# Patient Record
Sex: Male | Born: 1995
Health system: Southern US, Community
[De-identification: ages and names within clinical notes are randomized; demographics above are authoritative.]

## PROBLEM LIST (undated history)

## (undated) HISTORY — PX: SKIN SURGERY: SHX2413

## (undated) HISTORY — PX: TONSILLECTOMY AND ADENOIDECTOMY: SHX28

---

## 2019-03-22 ENCOUNTER — Other Ambulatory Visit: Payer: Self-pay

## 2019-03-22 ENCOUNTER — Encounter: Payer: Self-pay | Admitting: Family Medicine

## 2019-03-22 ENCOUNTER — Ambulatory Visit (INDEPENDENT_AMBULATORY_CARE_PROVIDER_SITE_OTHER): Payer: Commercial Managed Care - PPO | Admitting: Family Medicine

## 2019-03-22 ENCOUNTER — Other Ambulatory Visit: Payer: Self-pay | Admitting: Family Medicine

## 2019-03-22 VITALS — BP 126/72 | HR 62 | Temp 98.2°F | Ht 75.0 in | Wt 189.0 lb

## 2019-03-22 DIAGNOSIS — R351 Nocturia: Secondary | ICD-10-CM | POA: Diagnosis not present

## 2019-03-22 DIAGNOSIS — Z0001 Encounter for general adult medical examination with abnormal findings: Secondary | ICD-10-CM | POA: Diagnosis not present

## 2019-03-22 DIAGNOSIS — N50811 Right testicular pain: Secondary | ICD-10-CM | POA: Diagnosis not present

## 2019-03-22 LAB — URINALYSIS, ROUTINE W REFLEX MICROSCOPIC
Bilirubin Urine: NEGATIVE
Hgb urine dipstick: NEGATIVE
Ketones, ur: NEGATIVE
Leukocytes,Ua: NEGATIVE
Nitrite: NEGATIVE
RBC / HPF: NONE SEEN (ref 0–?)
Specific Gravity, Urine: <=1.005 — AB (ref 1.000–1.030)
Total Protein, Urine: NEGATIVE
Urine Glucose: NEGATIVE
Urobilinogen, UA: 0.2 (ref 0.0–1.0)
WBC, UA: NONE SEEN (ref 0–?)
pH: 6 (ref 5.0–8.0)

## 2019-03-22 NOTE — Progress Notes (Signed)
Chief Complaint:  Juan Krueger is a 23 y.o. male who presents today for his annual comprehensive physical exam and to establish care.  Assessment/Plan:  Testicular Pain No abnormalities on exam.  Will check ultrasound.  If continues to have pain, would consider trial of low-dose NSAID versus urology referral.  Discussed reasons return to care.  Nocturia Likely due to polydipsia.  Check UA.  Preventative Healthcare: Flu vaccine declined.   Patient Counseling(The following topics were reviewed and/or handout was given):  -Nutrition: Stressed importance of moderation in sodium/caffeine intake, saturated fat and cholesterol, caloric balance, sufficient intake of fresh fruits, vegetables, and fiber.  -Stressed the importance of regular exercise.   -Substance Abuse: Discussed cessation/primary prevention of tobacco, alcohol, or other drug use; driving or other dangerous activities under the influence; availability of treatment for abuse.   -Injury prevention: Discussed safety belts, safety helmets, smoke detector, smoking near bedding or upholstery.   -Sexuality: Discussed sexually transmitted diseases, partner selection, use of condoms, avoidance of unintended pregnancy and contraceptive alternatives.   -Dental health: Discussed importance of regular tooth brushing, flossing, and dental visits.  -Health maintenance and immunizations reviewed. Please refer to Health maintenance section.  Return to care in 1 year for next preventative visit.     Subjective:  HPI:  He has been having some right testicular pain over the last couple of months. Worsened over the last couple of weeks. No treatments tried. 3/10 at worst.  He had something similar a couple of years ago.  Went to a urologist and told that he had no abnormalities.  Never had an ultrasound.  Not noticed any lumps.  Pain located the bottom part of his testicle.  No burning.  No dysuria.  No difficulty with urination.  He wakes up once  or twice a night to urinate.  Has been trying to cut back on his fluids at night but has not had any significant change in symptoms.  Lifestyle Diet: No specific diets or eating plans.  Exercise: Likes to lift 4-5 times per week.   Depression screen [facility] 2/9 03/22/2019  Decreased Interest 0  Down, Depressed, Hopeless 0  PHQ - 2 Score 0  Altered sleeping 0  Tired, decreased energy 0  Change in appetite 0  Feeling bad or failure about yourself  0  Trouble concentrating 0  Moving slowly or fidgety/restless 0  Suicidal thoughts 0  PHQ-9 Score 0    Health Maintenance Due  Topic Date Due  . HIV Screening  06/02/2010     ROS: Per HPI, otherwise a complete review of systems was negative.   PMH:  The following were reviewed and entered/updated in epic: History reviewed. No pertinent past medical history. There are no active problems to display for this patient.  Past Surgical History:  Procedure Laterality Date  . SKIN SURGERY    . TONSILLECTOMY AND ADENOIDECTOMY      Family History  Problem Relation Age of Onset  . Skin cancer Mother   . Skin cancer Father   . Prostate cancer Paternal Grandmother     Medications- reviewed and updated No current outpatient medications on file.   No current facility-administered medications for this visit.     Allergies-reviewed and updated Not on File  Social History   Socioeconomic History  . Marital status: Single    Spouse name: Not on file  . Number of children: Not on file  . Years of education: Not on file  . Highest education level: Not  on file  Occupational History  . Not on file  Social Needs  . Financial resource strain: Not on file  . Food insecurity    Worry: Not on file    Inability: Not on file  . Transportation needs    Medical: Not on file    Non-medical: Not on file  Tobacco Use  . Smoking status: Never Smoker  . Smokeless tobacco: Never Used  Substance and Sexual Activity  . Alcohol use: Never     Frequency: Never  . Drug use: Never  . Sexual activity: Not on file  Lifestyle  . Physical activity    Days per week: Not on file    Minutes per session: Not on file  . Stress: Not on file  Relationships  . Social Musician on phone: Not on file    Gets together: Not on file    Attends religious service: Not on file    Active member of club or organization: Not on file    Attends meetings of clubs or organizations: Not on file    Relationship status: Not on file  Other Topics Concern  . Not on file  Social History Narrative  . Not on file        Objective:  Physical Exam: BP 126/72   Pulse 62   Temp 98.2 F (36.8 C)   Ht 6\' 3"  (1.905 m)   Wt 189 lb (85.7 kg)   SpO2 99%   BMI 23.62 kg/m   Body mass index is 23.62 kg/m. Wt Readings from Last 3 Encounters:  03/22/19 189 lb (85.7 kg)   Gen: NAD, resting comfortably HEENT: TMs normal bilaterally. OP clear. No thyromegaly noted.  CV: RRR with no murmurs appreciated Pulm: NWOB, CTAB with no crackles, wheezes, or rhonchi GI: Normal bowel sounds present. Soft, Nontender, Nondistended. GU: Normal male genitalia.  Testicles palpated without abnormality. MSK: no edema, cyanosis, or clubbing noted Skin: warm, dry Neuro: CN2-12 grossly intact. Strength 5/5 in upper and lower extremities. Reflexes symmetric and intact bilaterally.  Psych: Normal affect and thought content     Caleb M. 03/24/19, MD 03/22/2019 9:50 AM

## 2019-03-22 NOTE — Patient Instructions (Signed)
It was very nice to see you today!  We will check an ultrasound.  We will check a urine sample.  Administer symptoms worsened over the next 2 weeks.  Come back in 1 year for your next physical, or sooner if needed.  Take care, Dr [name]  Please try these tips to maintain a healthy lifestyle:   Eat at least 3 REAL meals and 1-2 snacks per day.  Aim for no more than 5 hours between eating.  If you eat breakfast, please do so within one hour of getting up.    Obtain twice as many fruits/vegetables as protein or carbohydrate foods for both lunch and dinner. (Half of each meal should be fruits/vegetables, one quarter protein, and one quarter starchy carbs)   Cut down on sweet beverages. This includes juice, soda, and sweet tea.    Exercise at least 150 minutes every week.    Preventive Care 24-24 Years Old, Male Preventive care refers to lifestyle choices and visits with your health care provider that can promote health and wellness. This includes:  A yearly physical exam. This is also called an annual well check.  Regular dental and eye exams.  Immunizations.  Screening for certain conditions.  Healthy lifestyle choices, such as eating a healthy diet, getting regular exercise, not using drugs or products that contain nicotine and tobacco, and limiting alcohol use. What can I expect for my preventive care visit? Physical exam Your health care provider will check:  Height and weight. These may be used to calculate body mass index (BMI), which is a measurement that tells if you are at a healthy weight.  Heart rate and blood pressure.  Your skin for abnormal spots. Counseling Your health care provider may ask you questions about:  Alcohol, tobacco, and drug use.  Emotional well-being.  Home and relationship well-being.  Sexual activity.  Eating habits.  Work and work Statistician. What immunizations do I need?  Influenza (flu) vaccine  This is recommended every  year. Tetanus, diphtheria, and pertussis (Tdap) vaccine  You may need a Td booster every 10 years. Varicella (chickenpox) vaccine  You may need this vaccine if you have not already been vaccinated. Human papillomavirus (HPV) vaccine  If recommended by your health care provider, you may need three doses over 6 months. Measles, mumps, and rubella (MMR) vaccine  You may need at least one dose of MMR. You may also need a second dose. Meningococcal conjugate (MenACWY) vaccine  One dose is recommended if you are 24-24 years old and a Market researcher living in a residence hall, or if you have one of several medical conditions. You may also need additional booster doses. Pneumococcal conjugate (PCV13) vaccine  You may need this if you have certain conditions and were not previously vaccinated. Pneumococcal polysaccharide (PPSV23) vaccine  You may need one or two doses if you smoke cigarettes or if you have certain conditions. Hepatitis A vaccine  You may need this if you have certain conditions or if you travel or work in places where you may be exposed to hepatitis A. Hepatitis B vaccine  You may need this if you have certain conditions or if you travel or work in places where you may be exposed to hepatitis B. Haemophilus influenzae type b (Hib) vaccine  You may need this if you have certain risk factors. You may receive vaccines as individual doses or as more than one vaccine together in one shot (combination vaccines). Talk with your health care provider  about the risks and benefits of combination vaccines. What tests do I need? Blood tests  Lipid and cholesterol levels. These may be checked every 5 years starting at age 24.  Hepatitis C test.  Hepatitis B test. Screening   Diabetes screening. This is done by checking your blood sugar (glucose) after you have not eaten for a while (fasting).  Sexually transmitted disease (STD) testing. Talk with your health care  provider about your test results, treatment options, and if necessary, the need for more tests. Follow these instructions at home: Eating and drinking   Eat a diet that includes fresh fruits and vegetables, whole grains, lean protein, and low-fat dairy products.  Take vitamin and mineral supplements as recommended by your health care provider.  Do not drink alcohol if your health care provider tells you not to drink.  If you drink alcohol: ? Limit how much you have to 0-2 drinks a day. ? Be aware of how much alcohol is in your drink. In the [place] one drink equals one 12 oz bottle of beer (355 mL), one 5 oz glass of wine (148 mL), or one 1 oz glass of hard liquor (44 mL). Lifestyle  Take daily care of your teeth and gums.  Stay active. Exercise for at least 30 minutes on 5 or more days each week.  Do not use any products that contain nicotine or tobacco, such as cigarettes, e-cigarettes, and chewing tobacco. If you need help quitting, ask your health care provider.  If you are sexually active, practice safe sex. Use a condom or other form of protection to prevent STIs (sexually transmitted infections). What's next?  Go to your health care provider once a year for a well check visit.  Ask your health care provider how often you should have your eyes and teeth checked.  Stay up to date on all vaccines. This information is not intended to replace advice given to you by your health care provider. Make sure you discuss any questions you have with your health care provider. Document Released: 08/12/2001 Document Revised: 06/10/2018 Document Reviewed: 06/10/2018 Elsevier Patient Education  2020 Else[employer]  

## 2019-03-22 NOTE — Progress Notes (Signed)
Please inform patient of the following:  Urine is very dilute, but no other abnormalities. His urination issues are probably due to drinking too much fluid - do not need to do any further testing at this time.  Juan Krueger. [name], MD 03/22/2019 4:30 PM

## 2019-03-24 ENCOUNTER — Encounter: Payer: Self-pay | Admitting: Family Medicine

## 2019-03-31 ENCOUNTER — Other Ambulatory Visit: Payer: Self-pay

## 2019-03-31 DIAGNOSIS — Z0001 Encounter for general adult medical examination with abnormal findings: Secondary | ICD-10-CM

## 2019-04-08 ENCOUNTER — Other Ambulatory Visit: Payer: Self-pay

## 2019-04-08 ENCOUNTER — Other Ambulatory Visit (INDEPENDENT_AMBULATORY_CARE_PROVIDER_SITE_OTHER): Payer: Commercial Managed Care - PPO

## 2019-04-08 DIAGNOSIS — Z0001 Encounter for general adult medical examination with abnormal findings: Secondary | ICD-10-CM

## 2019-04-08 LAB — LIPID PANEL
Cholesterol: 152 mg/dL (ref 0–200)
HDL: 43.1 mg/dL (ref >39.00)
LDL Cholesterol: 94 mg/dL (ref 0–99)
NonHDL: 108.97
Total CHOL/HDL Ratio: 4
Triglycerides: 74 mg/dL (ref 0.0–149.0)
VLDL: 14.8 mg/dL (ref 0.0–40.0)

## 2019-04-08 NOTE — Progress Notes (Signed)
Please inform patient of the following:  Cholesterol levels are NORMAL - ok to fill out form for work.  Juan Krueger. [name], MD 04/08/2019 4:15 PM

## 2019-04-11 ENCOUNTER — Ambulatory Visit
Admission: RE | Admit: 2019-04-11 | Discharge: 2019-04-11 | Disposition: A | Discharge status: Home / Self Care | Payer: Commercial Managed Care - PPO | Source: Ambulatory Visit | Attending: Family Medicine | Admitting: Family Medicine

## 2019-04-11 DIAGNOSIS — Z0001 Encounter for general adult medical examination with abnormal findings: Secondary | ICD-10-CM

## 2019-04-11 DIAGNOSIS — N50811 Right testicular pain: Secondary | ICD-10-CM

## 2019-04-12 NOTE — Progress Notes (Signed)
Please inform patient of the following:  Patient has a small varicocele. This is a small dilation of blood vessels near his testicle. This is benign and should lead to any major issues, but this could be what is causing his pain. Recommend urology referral if he is still having significant pain.  Juan Krueger. [name], MD 04/12/2019 8:00 AM

## 2020-05-11 ENCOUNTER — Ambulatory Visit (INDEPENDENT_AMBULATORY_CARE_PROVIDER_SITE_OTHER): Payer: BC Managed Care – PPO | Admitting: Family Medicine

## 2020-05-11 ENCOUNTER — Encounter: Payer: Self-pay | Admitting: Family Medicine

## 2020-05-11 ENCOUNTER — Other Ambulatory Visit: Payer: Self-pay

## 2020-05-11 VITALS — BP 104/60 | HR 64 | Temp 98.1°F | Ht 75.0 in | Wt 200.8 lb

## 2020-05-11 DIAGNOSIS — Z0001 Encounter for general adult medical examination with abnormal findings: Secondary | ICD-10-CM | POA: Diagnosis not present

## 2020-05-11 DIAGNOSIS — I861 Scrotal varices: Secondary | ICD-10-CM | POA: Insufficient documentation

## 2020-05-11 DIAGNOSIS — G8929 Other chronic pain: Secondary | ICD-10-CM | POA: Diagnosis not present

## 2020-05-11 DIAGNOSIS — M25512 Pain in left shoulder: Secondary | ICD-10-CM | POA: Diagnosis not present

## 2020-05-11 NOTE — Progress Notes (Signed)
Chief Complaint:  Juan Krueger is a 24 y.o. male who presents today for his annual comprehensive physical exam.    Assessment/Plan:  New/Acute Problems: Shoulder pain Given mechanical symptoms concerning for possible subluxation or even potential labral tear though pain is not currently significant.  Will place referral to sports medicine for further evaluation/management.  Chronic Problems Addressed Today: Varicocele No red flags.  Symptoms are overall stable.  Occasionally has pain.  Discussed referral to urology however he deferred for the time being.Discussed reasons to return to care.    Preventative Healthcare: UTD.   Patient Counseling(The following topics were reviewed and/or handout was given):  -Nutrition: Stressed importance of moderation in sodium/caffeine intake, saturated fat and cholesterol, caloric balance, sufficient intake of fresh fruits, vegetables, and fiber.  -Stressed the importance of regular exercise.   -Substance Abuse: Discussed cessation/primary prevention of tobacco, alcohol, or other drug use; driving or other dangerous activities under the influence; availability of treatment for abuse.   -Injury prevention: Discussed safety belts, safety helmets, smoke detector, smoking near bedding or upholstery.   -Sexuality: Discussed sexually transmitted diseases, partner selection, use of condoms, avoidance of unintended pregnancy and contraceptive alternatives.   -Dental health: Discussed importance of regular tooth brushing, flossing, and dental visits.  -Health maintenance and immunizations reviewed. Please refer to Health maintenance section.  Return to care in 1 year for next preventative visit.     Subjective:  HPI:  Is been having issues with left shoulder popping for the last couple of years.  No pain at rest.  Has pain with certain motions such as rolling his shoulder from front to back.  Has a history of playing lacrosse and football but does not  remember any specific injuries.  No specific treatments tried.  Symptoms are overall stable not been this way for the past year or more.  Lifestyle Diet: None speicifc.  Exercise: Working on [employer] a few times per week.   Depression screen PHQ 2/9 05/11/2020  Decreased Interest 0  Down, Depressed, Hopeless 0  PHQ - 2 Score 0  Altered sleeping -  Tired, decreased energy -  Change in appetite -  Feeling bad or failure about yourself  -  Trouble concentrating -  Moving slowly or fidgety/restless -  Suicidal thoughts -  PHQ-9 Score -    Health Maintenance Due  Topic Date Due  . Hepatitis C Screening  Never done  . COVID-19 Vaccine (1) Never done  . HIV Screening  Never done  . TETANUS/TDAP  Never done     ROS: Per HPI, otherwise a complete review of systems was negative.   PMH:  The following were reviewed and entered/updated in epic: History reviewed. No pertinent past medical history. Patient Active Problem List   Diagnosis Date Noted  . Varicocele 05/11/2020   Past Surgical History:  Procedure Laterality Date  . SKIN SURGERY    . TONSILLECTOMY AND ADENOIDECTOMY     Family History  Problem Relation Age of Onset  . Skin cancer Mother   . Skin cancer Father   . Prostate cancer Paternal Grandmother    Medications- reviewed and updated No current outpatient medications on file.   No current facility-administered medications for this visit.    Allergies-reviewed and updated No Known Allergies  Social History   Socioeconomic History  . Marital status: Single    Spouse name: Not on file  . Number of children: Not on file  . Years of education: Not on file  .  Highest education level: Not on file  Occupational History  . Not on file  Tobacco Use  . Smoking status: Never Smoker  . Smokeless tobacco: Never Used  Vaping Use  . Vaping Use: Never used  Substance and Sexual Activity  . Alcohol use: Never  . Drug use: Never  . Sexual activity: Not on file    Other Topics Concern  . Not on file  Social History Narrative  . Not on file   Social Determinants of Health   Financial Resource Strain:   . Difficulty of Paying Living Expenses: Not on file  Food Insecurity:   . Worried About Programme researcher, broadcasting/film/video in the Last Year: Not on file  . Ran Out of Food in the Last Year: Not on file  Transportation Needs:   . Lack of Transportation (Medical): Not on file  . Lack of Transportation (Non-Medical): Not on file  Physical Activity:   . Days of Exercise per Week: Not on file  . Minutes of Exercise per Session: Not on file  Stress:   . Feeling of Stress : Not on file  Social Connections:   . Frequency of Communication with Friends and Family: Not on file  . Frequency of Social Gatherings with Friends and Family: Not on file  . Attends Religious Services: Not on file  . Active Member of Clubs or Organizations: Not on file  . Attends Banker Meetings: Not on file  . Marital Status: Not on file        Objective:  Physical Exam: BP 104/60   Pulse 64   Temp 98.1 F (36.7 C) (Temporal)   Ht 6\' 3"  (1.905 m)   Wt 200 lb 12.8 oz (91.1 kg)   SpO2 99%   BMI 25.10 kg/m   Body mass index is 25.1 kg/m. Wt Readings from Last 3 Encounters:  05/11/20 200 lb 12.8 oz (91.1 kg)  03/22/19 189 lb (85.7 kg)   Gen: NAD, resting comfortably HEENT: TMs normal bilaterally. OP clear. No thyromegaly noted.  CV: RRR with no murmurs appreciated Pulm: NWOB, CTAB with no crackles, wheezes, or rhonchi GI: Normal bowel sounds present. Soft, Nontender, Nondistended. MSK: no edema, cyanosis, or clubbing noted.  Left shoulder without obvious deformities.  Popping sensation palpated with passive rolling of shoulder.  Supraspinatus testing intact.  Normal internal and external rotation.  Neurovascular intact distally. Skin: warm, dry Neuro: CN2-12 grossly intact. Strength 5/5 in upper and lower extremities. Reflexes symmetric and intact bilaterally.   Psych: Normal affect and thought content     Juan Krueger M. 03/24/19, MD 05/11/2020 3:37 PM

## 2020-05-11 NOTE — Patient Instructions (Signed)
It was very nice to see you today!  We will place a referral for you to see a sports medicine doctor for your shoulder.  Keep up the good work  I will see you back in a year.  Please come back to see me sooner if needed.  Take care, Dr [name]  Please try these tips to maintain a healthy lifestyle:   Eat at least 3 REAL meals and 1-2 snacks per day.  Aim for no more than 5 hours between eating.  If you eat breakfast, please do so within one hour of getting up.    Each meal should contain half fruits/vegetables, one quarter protein, and one quarter carbs (no bigger than a computer mouse)   Cut down on sweet beverages. This includes juice, soda, and sweet tea.     Drink at least 1 glass of water with each meal and aim for at least 8 glasses per day   Exercise at least 150 minutes every week.    Preventive Care 5-49 Years Old, Male Preventive care refers to lifestyle choices and visits with your health care provider that can promote health and wellness. This includes:  A yearly physical exam. This is also called an annual well check.  Regular dental and eye exams.  Immunizations.  Screening for certain conditions.  Healthy lifestyle choices, such as eating a healthy diet, getting regular exercise, not using drugs or products that contain nicotine and tobacco, and limiting alcohol use. What can I expect for my preventive care visit? Physical exam Your health care provider will check:  Height and weight. These may be used to calculate body mass index (BMI), which is a measurement that tells if you are at a healthy weight.  Heart rate and blood pressure.  Your skin for abnormal spots. Counseling Your health care provider may ask you questions about:  Alcohol, tobacco, and drug use.  Emotional well-being.  Home and relationship well-being.  Sexual activity.  Eating habits.  Work and work Statistician. What immunizations do I need?  Influenza (flu)  vaccine  This is recommended every year. Tetanus, diphtheria, and pertussis (Tdap) vaccine  You may need a Td booster every 10 years. Varicella (chickenpox) vaccine  You may need this vaccine if you have not already been vaccinated. Human papillomavirus (HPV) vaccine  If recommended by your health care provider, you may need three doses over 6 months. Measles, mumps, and rubella (MMR) vaccine  You may need at least one dose of MMR. You may also need a second dose. Meningococcal conjugate (MenACWY) vaccine  One dose is recommended if you are 51-91 years old and a Market researcher living in a residence hall, or if you have one of several medical conditions. You may also need additional booster doses. Pneumococcal conjugate (PCV13) vaccine  You may need this if you have certain conditions and were not previously vaccinated. Pneumococcal polysaccharide (PPSV23) vaccine  You may need one or two doses if you smoke cigarettes or if you have certain conditions. Hepatitis A vaccine  You may need this if you have certain conditions or if you travel or work in places where you may be exposed to hepatitis A. Hepatitis B vaccine  You may need this if you have certain conditions or if you travel or work in places where you may be exposed to hepatitis B. Haemophilus influenzae type b (Hib) vaccine  You may need this if you have certain risk factors. You may receive vaccines as individual doses or  as more than one vaccine together in one shot (combination vaccines). Talk with your health care provider about the risks and benefits of combination vaccines. What tests do I need? Blood tests  Lipid and cholesterol levels. These may be checked every 5 years starting at age 10.  Hepatitis C test.  Hepatitis B test. Screening   Diabetes screening. This is done by checking your blood sugar (glucose) after you have not eaten for a while (fasting).  Sexually transmitted disease (STD)  testing. Talk with your health care provider about your test results, treatment options, and if necessary, the need for more tests. Follow these instructions at home: Eating and drinking   Eat a diet that includes fresh fruits and vegetables, whole grains, lean protein, and low-fat dairy products.  Take vitamin and mineral supplements as recommended by your health care provider.  Do not drink alcohol if your health care provider tells you not to drink.  If you drink alcohol: ? Limit how much you have to 0-2 drinks a day. ? Be aware of how much alcohol is in your drink. In the [place] one drink equals one 12 oz bottle of beer (355 mL), one 5 oz glass of wine (148 mL), or one 1 oz glass of hard liquor (44 mL). Lifestyle  Take daily care of your teeth and gums.  Stay active. Exercise for at least 30 minutes on 5 or more days each week.  Do not use any products that contain nicotine or tobacco, such as cigarettes, e-cigarettes, and chewing tobacco. If you need help quitting, ask your health care provider.  If you are sexually active, practice safe sex. Use a condom or other form of protection to prevent STIs (sexually transmitted infections). What's next?  Go to your health care provider once a year for a well check visit.  Ask your health care provider how often you should have your eyes and teeth checked.  Stay up to date on all vaccines. This information is not intended to replace advice given to you by your health care provider. Make sure you discuss any questions you have with your health care provider. Document Revised: 06/10/2018 Document Reviewed: 06/10/2018 Elsevier Patient Education  2020 [employer].

## 2020-05-11 NOTE — Assessment & Plan Note (Signed)
No red flags.  Symptoms are overall stable.  Occasionally has pain.  Discussed referral to urology however he deferred for the time being.Discussed reasons to return to care.

## 2020-05-22 ENCOUNTER — Ambulatory Visit: Payer: BC Managed Care – PPO | Admitting: Family Medicine

## 2020-05-22 NOTE — Progress Notes (Signed)
I, Philbert Riser, LAT, ATC acting as a scribe for Clementeen Graham, MD.  Subjective:    I'm seeing this patient as a consultation for Dr. Jacquiline Doe. Note will be routed back to referring provider/PCP.  CC: Left shoulder pain  HPI: Pt is a 24yo male c/o pn in his L shoulder. Pt locates pn on the anterior aspect of L shoulder. Pt reports pn has been bothering him for a couple years. No known MOI. Pt notes shoulder feels weaker, pn when lifting weights (overhead press). Popping sounds noted over posterior region. Pt also reports it hurts to sleep on L side.  Rx tried: none  Past medical history, Surgical history, Family history, Social history, Allergies, and medications have been entered into the medical record, reviewed.   Review of Systems: No new headache, visual changes, nausea, vomiting, diarrhea, constipation, dizziness, abdominal pain, skin rash, fevers, chills, night sweats, weight loss, swollen lymph nodes, body aches, joint swelling, muscle aches, chest pain, shortness of breath, mood changes, visual or auditory hallucinations.   Objective:    Vitals:   05/23/20 1606  BP: 120/72  Pulse: (!) 56  SpO2: 100%   General: Well Developed, well nourished, and in no acute distress.  Neuro/Psych: Alert and oriented x3, extra-ocular muscles intact, able to move all 4 extremities, sensation grossly intact. Skin: Warm and dry, no rashes noted.  Respiratory: Not using accessory muscles, speaking in full sentences, trachea midline.  Cardiovascular: Pulses palpable, no extremity edema. Abdomen: Does not appear distended. MSK: Left shoulder normal-appearing Nontender. Normal motion with crepitation. Intact strength abduction external/internal rotation. Mildly positive Hawkins and Neer's test.  Negative Yergason's and speeds test. Mildly positive crossarm compression test. Negative apprehension and relocation test. Pulses cap refill and sensation are intact distally.  Lab and Radiology  Results  X-ray images left shoulder obtained today personally and independently interpreted No fractures or significant malalignment.  No significant DJD. Await formal radiology review  Diagnostic Limited MSK Ultrasound of: Left shoulder Biceps tendon intact normal-appearing Subscapularis tendon intact normal Supraspinatus tendon is intact. Moderate thickness subacromial bursa present. Supraspinatus tendon intact normal-appearing AC joint normal-appearing Impression: Subacromial bursitis   Impression and Recommendations:    Assessment and Plan: 24 y.o. male with left shoulder pain chronic ongoing for years worsening slightly recently.  Dominant finding today is subacromial bursitis on ultrasound.  The main issue is subacromial impingement.  Discussed options with patient.  Plan for trial of physical therapy.  Recheck back in 6 to 8 weeks.  If not better would consider either injection or possibly MRI arthrogram..  PDMP not reviewed this encounter. Orders Placed This Encounter  Procedures  . DG Shoulder Left    Standing Status:   Future    Number of Occurrences:   1    Standing Expiration Date:   05/23/2021    Order Specific Question:   Reason for Exam (SYMPTOM  OR DIAGNOSIS REQUIRED)    Answer:   chronic shoulder pain    Order Specific Question:   Preferred imaging location?    Answer:   [facility]  . [place] LIMITED JOINT SPACE STRUCTURES UP LEFT(NO LINKED CHARGES)    Standing Status:   Future    Number of Occurrences:   1    Standing Expiration Date:   11/20/2020    Order Specific Question:   Reason for Exam (SYMPTOM  OR DIAGNOSIS REQUIRED)    Answer:   chronic left shoulder pain    Order Specific Question:  Preferred imaging location?    Answer:   [facility]  . Ambulatory referral to Physical Therapy    Referral Priority:   Routine    Referral Type:   Physical Medicine    Referral Reason:   Specialty Services Required    Requested  Specialty:   Physical Therapy   No orders of the defined types were placed in this encounter.   Discussed warning signs or symptoms. Please see discharge instructions. Patient expresses understanding.   The above documentation has been reviewed and is accurate and complete Clementeen Graham, M.D.

## 2020-05-23 ENCOUNTER — Encounter: Payer: Self-pay | Admitting: Family Medicine

## 2020-05-23 ENCOUNTER — Ambulatory Visit (INDEPENDENT_AMBULATORY_CARE_PROVIDER_SITE_OTHER): Payer: BC Managed Care – PPO | Admitting: Family Medicine

## 2020-05-23 ENCOUNTER — Other Ambulatory Visit: Payer: Self-pay

## 2020-05-23 ENCOUNTER — Ambulatory Visit: Payer: Self-pay

## 2020-05-23 ENCOUNTER — Ambulatory Visit (INDEPENDENT_AMBULATORY_CARE_PROVIDER_SITE_OTHER): Payer: BC Managed Care – PPO

## 2020-05-23 VITALS — BP 120/72 | HR 56 | Ht 75.0 in | Wt 200.4 lb

## 2020-05-23 DIAGNOSIS — G8929 Other chronic pain: Secondary | ICD-10-CM | POA: Diagnosis not present

## 2020-05-23 DIAGNOSIS — M25512 Pain in left shoulder: Secondary | ICD-10-CM | POA: Diagnosis not present

## 2020-05-23 NOTE — Patient Instructions (Signed)
Thank you for coming in today.  Please get an Xray today before you leave  I've referred you to Physical Therapy.  Let us know if you don't hear from them in one week.  Please use voltaren gel up to 4x daily for pain as needed.   Recheck back with me in 6-8 weeks especially if not better.  If all better no need to follow up.      Shoulder Impingement Syndrome Rehab Ask your health care provider which exercises are safe for you. Do exercises exactly as told by your health care provider and adjust them as directed. It is normal to feel mild stretching, pulling, tightness, or discomfort as you do these exercises. Stop right away if you feel sudden pain or your pain gets worse. Do not begin these exercises until told by your health care provider. Stretching and range-of-motion exercise This exercise warms up your muscles and joints and improves the movement and flexibility of your shoulder. This exercise also helps to relieve pain and stiffness. Passive horizontal adduction In passive adduction, you use your other hand to move the injured arm toward your body. The injured arm does not move on its own. In this movement, your arm is moved across your body in the horizontal plane (horizontal adduction). 1. Sit or stand and pull your left / right elbow across your chest, toward your other shoulder. Stop when you feel a gentle stretch in the back of your shoulder and upper arm. ? Keep your arm at shoulder height. ? Keep your arm as close to your body as you comfortably can. 2. Hold for __________ seconds. 3. Slowly return to the starting position. Repeat __________ times. Complete this exercise __________ times a day. Strengthening exercises These exercises build strength and endurance in your shoulder. Endurance is the ability to use your muscles for a long time, even after they get tired. External rotation, isometric This is an exercise in which you press the back of your wrist against a door  frame without moving your shoulder joint (isometric). 1. Stand or sit in a doorway, facing the door frame. 2. Bend your left / right elbow and place the back of your wrist against the door frame. Only the back of your wrist should be touching the frame. Keep your upper arm at your side. 3. Gently press your wrist against the door frame, as if you are trying to push your arm away from your abdomen (external rotation). Press as hard as you are able without pain. ? Avoid shrugging your shoulder while you press your wrist against the door frame. Keep your shoulder blade tucked down toward the middle of your back. 4. Hold for __________ seconds. 5. Slowly release the tension, and relax your muscles completely before you repeat the exercise. Repeat __________ times. Complete this exercise __________ times a day. Internal rotation, isometric This is an exercise in which you press your palm against a door frame without moving your shoulder joint (isometric). 1. Stand or sit in a doorway, facing the door frame. 2. Bend your left / right elbow and place the palm of your hand against the door frame. Only your palm should be touching the frame. Keep your upper arm at your side. 3. Gently press your hand against the door frame, as if you are trying to push your arm toward your abdomen (internal rotation). Press as hard as you are able without pain. ? Avoid shrugging your shoulder while you press your hand against the door frame.  Keep your shoulder blade tucked down toward the middle of your back. 4. Hold for __________ seconds. 5. Slowly release the tension, and relax your muscles completely before you repeat the exercise. Repeat __________ times. Complete this exercise __________ times a day. Scapular protraction, supine  1. Lie on your back on a firm surface (supine position). Hold a __________ weight in your left / right hand. 2. Raise your left / right arm straight into the air so your hand is directly  above your shoulder joint. 3. Push the weight into the air so your shoulder (scapula) lifts off the surface that you are lying on. The scapula will push up or forward (protraction). Do not move your head, neck, or back. 4. Hold for __________ seconds. 5. Slowly return to the starting position. Let your muscles relax completely before you repeat this exercise. Repeat __________ times. Complete this exercise __________ times a day. Scapular retraction  1. Sit in a stable chair without armrests, or stand up. 2. Secure an exercise band to a stable object in front of you so the band is at shoulder height. 3. Hold one end of the exercise band in each hand. Your palms should face down. 4. Squeeze your shoulder blades together (retraction) and move your elbows slightly behind you. Do not shrug your shoulders upward while you do this. 5. Hold for __________ seconds. 6. Slowly return to the starting position. Repeat __________ times. Complete this exercise __________ times a day. Shoulder extension  1. Sit in a stable chair without armrests, or stand up. 2. Secure an exercise band to a stable object in front of you so the band is above shoulder height. 3. Hold one end of the exercise band in each hand. 4. Straighten your elbows and lift your hands up to shoulder height. 5. Squeeze your shoulder blades together and pull your hands down to the sides of your thighs (extension). Stop when your hands are straight down by your sides. Do not let your hands go behind your body. 6. Hold for __________ seconds. 7. Slowly return to the starting position. Repeat __________ times. Complete this exercise __________ times a day. This information is not intended to replace advice given to you by your health care provider. Make sure you discuss any questions you have with your health care provider. Document Revised: 10/08/2018 Document Reviewed: 07/12/2018 Elsevier Patient Education  2020 [employer].

## 2020-05-28 NOTE — Progress Notes (Signed)
X-ray left shoulder looks normal to radiology

## 2020-06-02 IMAGING — US US SCROTUM W/ DOPPLER COMPLETE
1 series · 14 of 25 positions shown · non-contrast
Comparison: None.

CLINICAL DATA: Right scrotal pain

EXAM:
SCROTAL ULTRASOUND
DOPPLER ULTRASOUND OF THE TESTICLES
TECHNIQUE: Complete ultrasound examination of the testicles, epididymis, and
other scrotal structures was performed. Color and spectral Doppler
ultrasound were also utilized to evaluate blood flow to the
testicles.

[Series 1: us scrotum w/ doppler complete · 0.06mm/px · 14 of 50 slices shown]
[im 1/50]
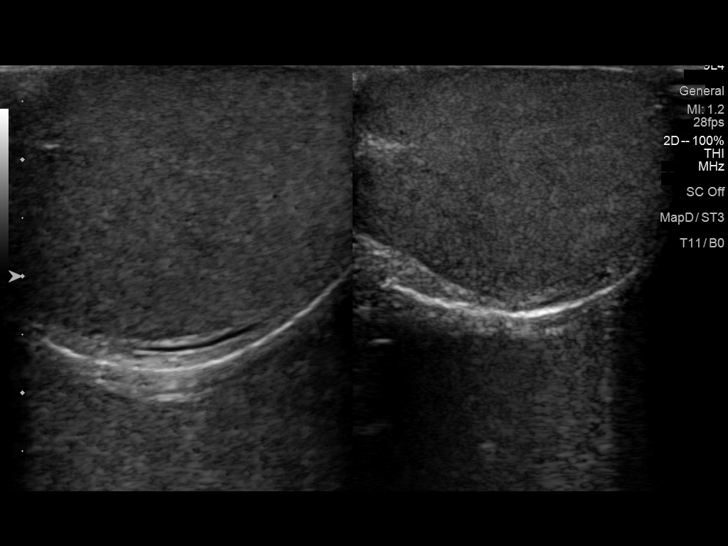
[im 5/50]
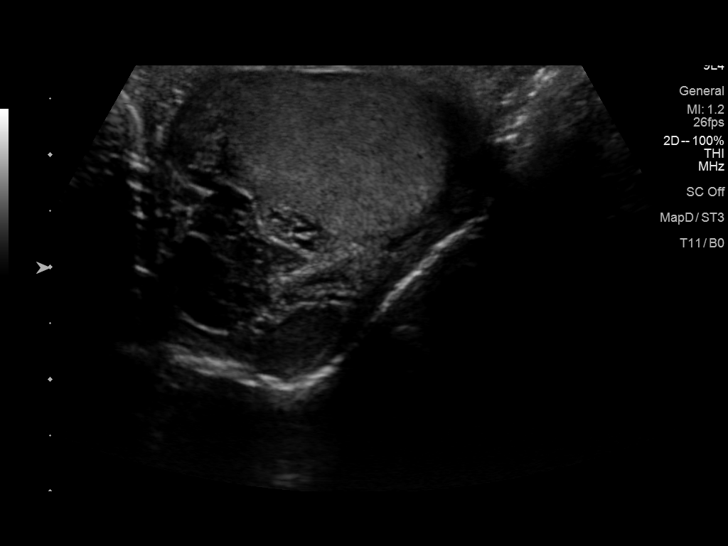
[im 9/50]
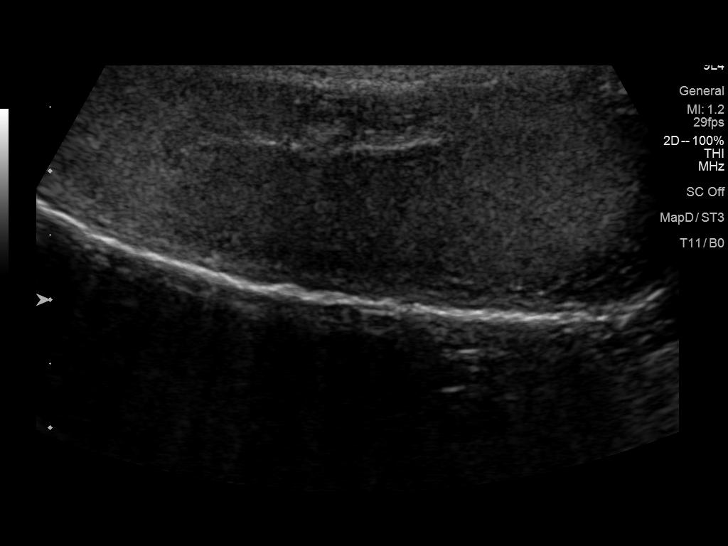
[im 13/50]
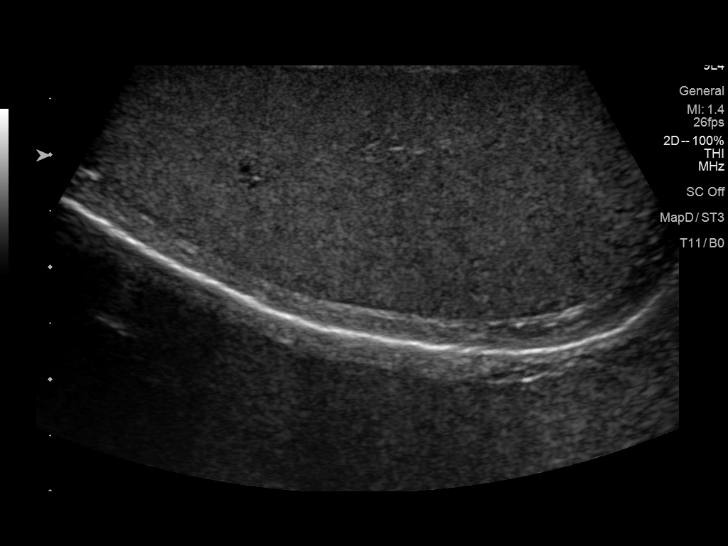
[im 17/50]
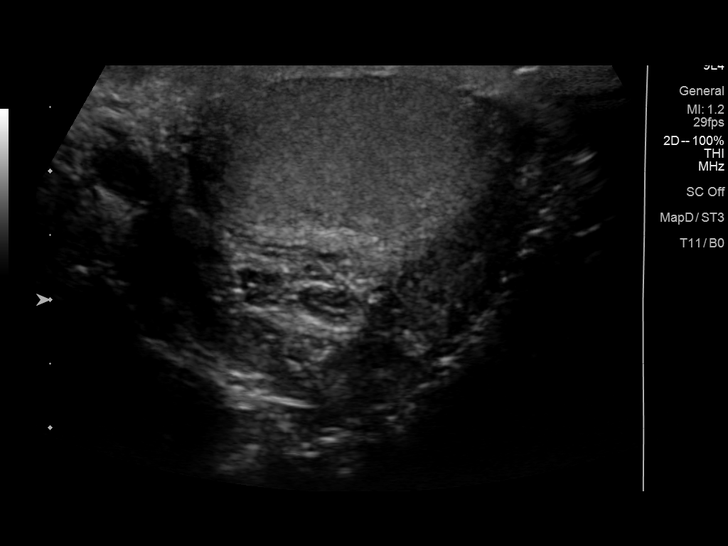
[im 19/50]
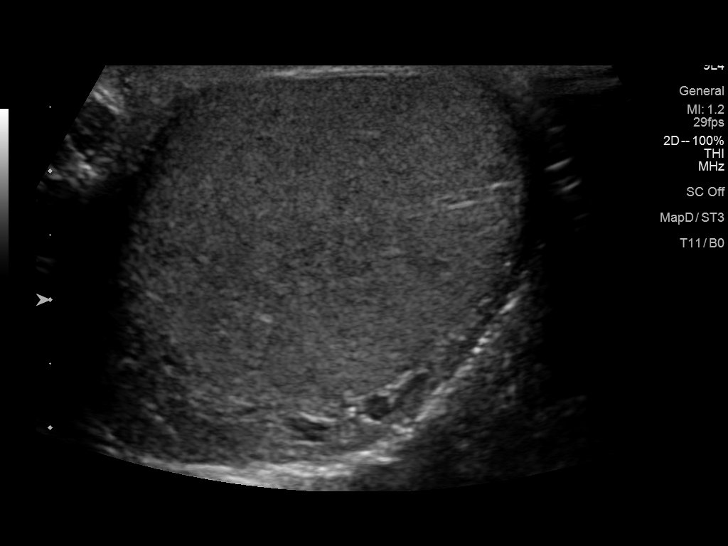
[im 23/50]
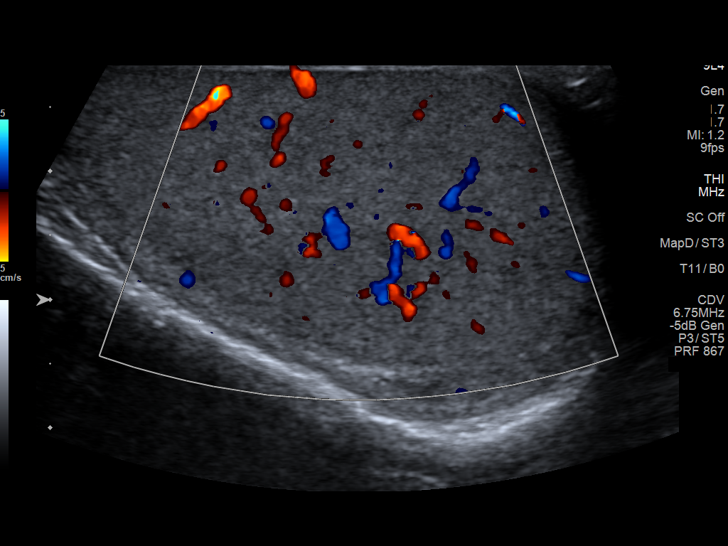
[im 27/50]
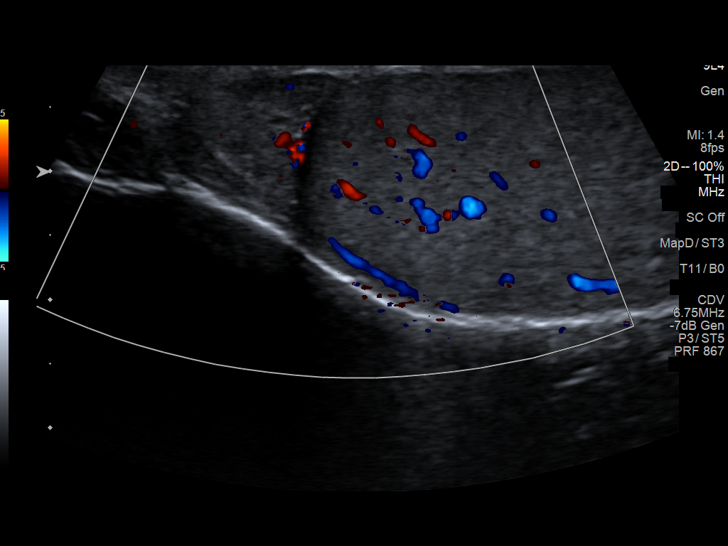
[im 31/50]
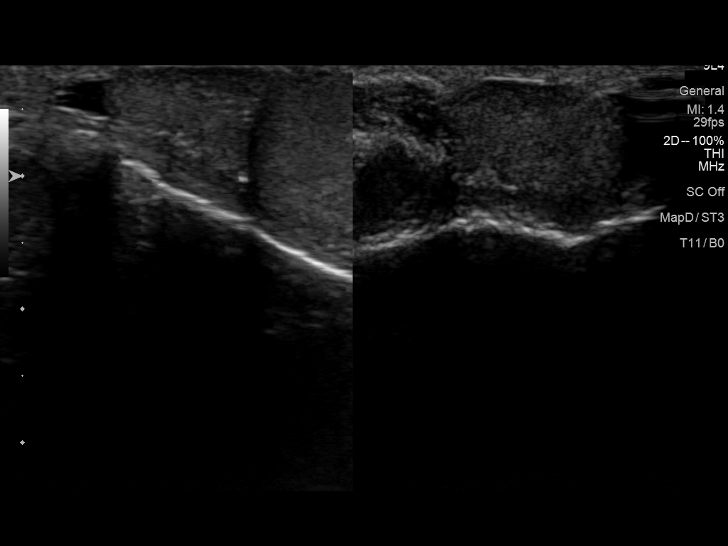
[im 33/50]
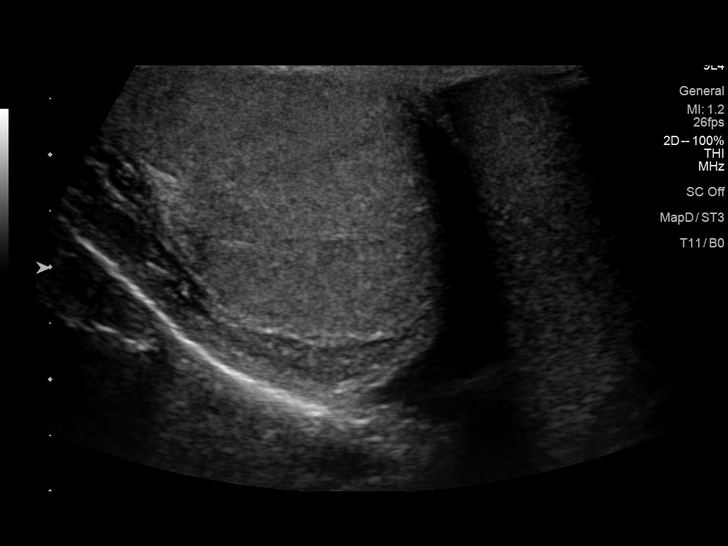
[im 37/50]
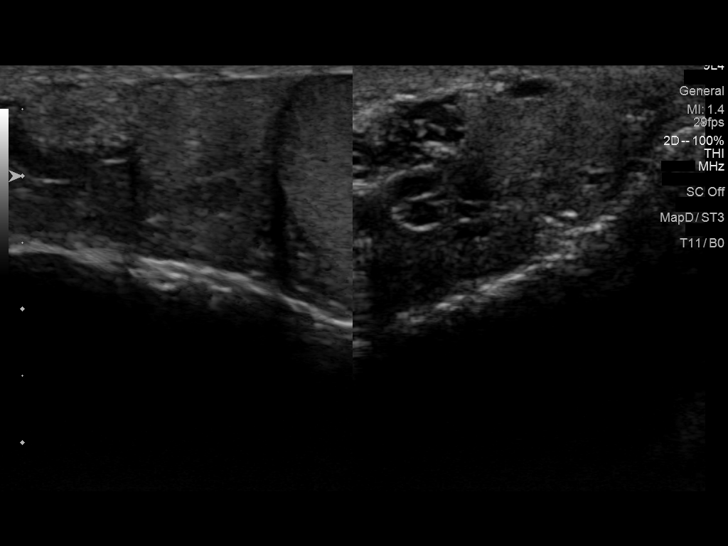
[im 41/50]
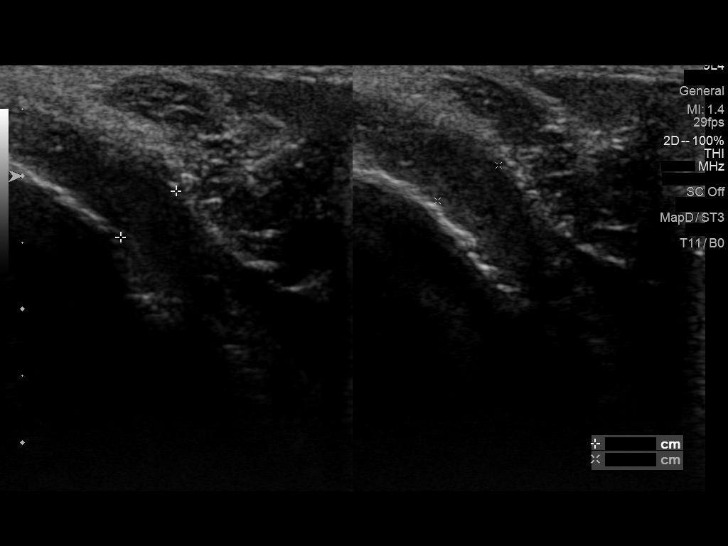
[im 45/50]
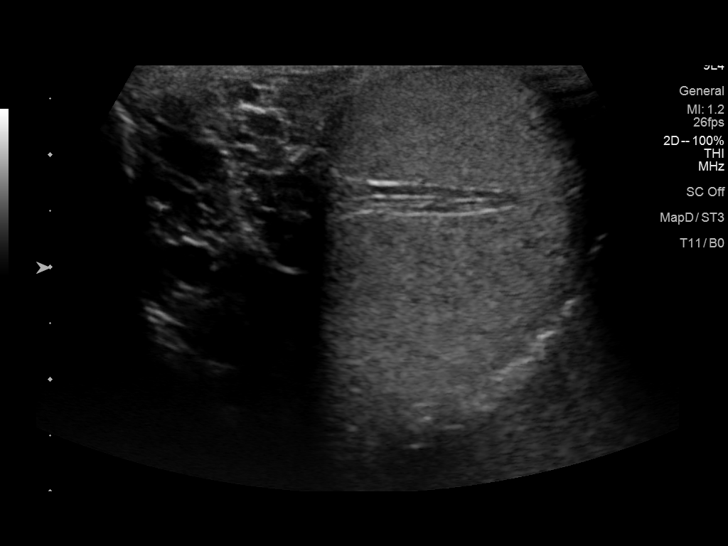
[im 50/50]
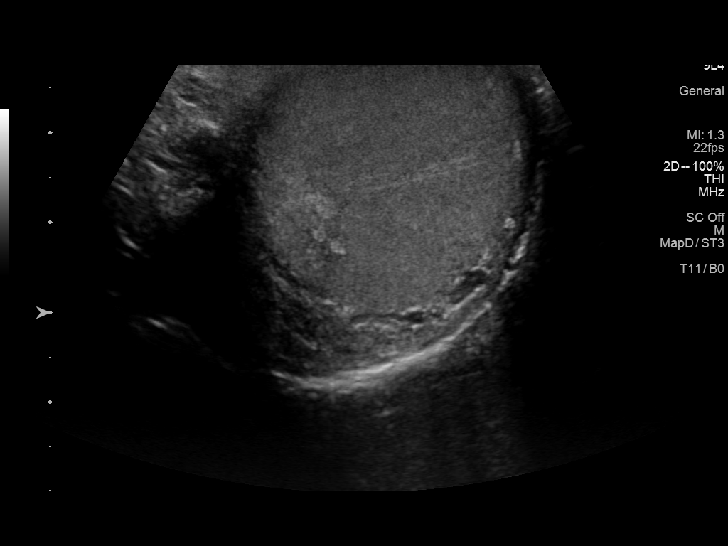

[14 of 25 positions shown; findings below may reference images not displayed]

FINDINGS: Right testicle

Measurements: 5.1 x 2.2 x 3.1 cm. No mass or microlithiasis
visualized.

Left testicle

Measurements: 4.9 x 2.4 x 3.1 cm. No mass or microlithiasis
visualized.

Right epididymis:  Normal in size and appearance.

Left epididymis:  Normal in size and appearance.

Hydrocele:  None visualized.

Varicocele:  Small right-sided varicocele.

Pulsed Doppler interrogation of both testes demonstrates normal low
resistance arterial and venous waveforms bilaterally.
IMPRESSION: 1. Negative for testicular torsion.
2. Small right-sided varicocele.

## 2020-06-11 ENCOUNTER — Telehealth: Payer: Self-pay

## 2020-06-11 NOTE — Telephone Encounter (Signed)
Wanting to be STD tested. Scheduled for Friday.

## 2020-06-11 NOTE — Telephone Encounter (Signed)
See below

## 2020-06-11 NOTE — Telephone Encounter (Signed)
Please check with patient if he has any symptoms. If just routine screening then ok to place orders.  Juan Krueger. [name], MD 06/11/2020 3:46 PM

## 2020-06-11 NOTE — Telephone Encounter (Signed)
Patient schuedule appt with PCP on Dec 17

## 2020-06-15 ENCOUNTER — Other Ambulatory Visit: Payer: Self-pay

## 2020-06-15 ENCOUNTER — Other Ambulatory Visit: Payer: Self-pay | Admitting: *Deleted

## 2020-06-15 ENCOUNTER — Other Ambulatory Visit: Payer: BC Managed Care – PPO

## 2020-06-15 ENCOUNTER — Other Ambulatory Visit (HOSPITAL_COMMUNITY)
Admission: RE | Admit: 2020-06-15 | Discharge: 2020-06-15 | Disposition: A | Discharge status: Home / Self Care | Payer: BC Managed Care – PPO | Source: Ambulatory Visit | Attending: Family Medicine | Admitting: Family Medicine

## 2020-06-15 DIAGNOSIS — Z202 Contact with and (suspected) exposure to infections with a predominantly sexual mode of transmission: Secondary | ICD-10-CM

## 2020-06-18 LAB — HEPATITIS C ANTIBODY
Hepatitis C Ab: NONREACTIVE
SIGNAL TO CUT-OFF: 0.01 (ref <1.00)

## 2020-06-18 LAB — RPR: RPR Ser Ql: NONREACTIVE

## 2020-06-18 LAB — HIV ANTIBODY (ROUTINE TESTING W REFLEX): HIV 1&2 Ab, 4th Generation: NONREACTIVE

## 2020-06-19 ENCOUNTER — Telehealth: Payer: Self-pay | Admitting: *Deleted

## 2020-06-19 NOTE — Telephone Encounter (Signed)
Left message to return call to our office at their convenience.  Need to reorder urine test,

## 2020-06-19 NOTE — Progress Notes (Signed)
Please inform patient of the following:  Blood work all negative for STDs. We are still waiting on urine test.  Juan Krueger. [name], MD 06/19/2020 2:30 PM

## 2020-06-20 ENCOUNTER — Other Ambulatory Visit: Payer: BC Managed Care – PPO

## 2020-06-21 LAB — URINE CYTOLOGY ANCILLARY ONLY
Chlamydia: NEGATIVE
Comment: NEGATIVE
Comment: NEGATIVE
Comment: NORMAL
Neisseria Gonorrhea: NEGATIVE
Trichomonas: NEGATIVE

## 2020-06-25 NOTE — Progress Notes (Signed)
Please inform patient of the following:  Urine STD test was negative.  Katina Degree. [name], MD 06/25/2020 8:05 AM

## 2021-05-17 ENCOUNTER — Other Ambulatory Visit: Payer: Self-pay

## 2021-05-17 ENCOUNTER — Encounter: Payer: Self-pay | Admitting: Family Medicine

## 2021-05-17 ENCOUNTER — Ambulatory Visit (INDEPENDENT_AMBULATORY_CARE_PROVIDER_SITE_OTHER): Payer: BC Managed Care – PPO | Admitting: Family Medicine

## 2021-05-17 VITALS — BP 110/67 | HR 85 | Temp 97.5°F | Ht 75.0 in | Wt 194.8 lb

## 2021-05-17 DIAGNOSIS — G43809 Other migraine, not intractable, without status migrainosus: Secondary | ICD-10-CM | POA: Diagnosis not present

## 2021-05-17 DIAGNOSIS — Z0001 Encounter for general adult medical examination with abnormal findings: Secondary | ICD-10-CM

## 2021-05-17 DIAGNOSIS — Z13220 Encounter for screening for lipoid disorders: Secondary | ICD-10-CM

## 2021-05-17 DIAGNOSIS — Z131 Encounter for screening for diabetes mellitus: Secondary | ICD-10-CM | POA: Diagnosis not present

## 2021-05-17 DIAGNOSIS — G43909 Migraine, unspecified, not intractable, without status migrainosus: Secondary | ICD-10-CM | POA: Insufficient documentation

## 2021-05-17 MED ORDER — CYCLOBENZAPRINE HCL 10 MG PO TABS: 10.0000 mg | ORAL_TABLET | Freq: Three times a day (TID) | ORAL | 0 refills | Status: DC | PRN

## 2021-05-17 MED ORDER — SUMATRIPTAN SUCCINATE 50 MG PO TABS: 50.0000 mg | ORAL_TABLET | ORAL | 0 refills | Status: DC | PRN
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
## 2021-05-17 NOTE — Assessment & Plan Note (Signed)
No red flags.  Reassuring neuro exam.  History consistent with migraines.  We will check labs today.  Start Imitrex as needed.  Do not think he needs preventive medication at this point. He will check in with me in a few weeks.  May consider referral to neurology if this continues to be an issue.

## 2021-05-17 NOTE — Patient Instructions (Signed)
It was very nice to see you today!  I think you are probably having migraines.  Please take the Imitrex as needed for severe headaches.  You can try the Flexeril to help with the swelling today.  Please work on the exercises as well.  We will check blood work.  I will see back in year.  Comeback to see me sooner if needed.  Take care, Dr [name]  PLEASE NOTE:  If you had any lab tests please let us know if you have not heard back within a few days. You may see your results on mychart before we have a chance to review them but we will give you a call once they are reviewed by [place]. If we ordered any referrals today, please let us know if you have not heard from their office within the next week.   Please try these tips to maintain a healthy lifestyle:  Eat at least 3 REAL meals and 1-2 snacks per day.  Aim for no more than 5 hours between eating.  If you eat breakfast, please do so within one hour of getting up.   Each meal should contain half fruits/vegetables, one quarter protein, and one quarter carbs (no bigger than a computer mouse)  Cut down on sweet beverages. This includes juice, soda, and sweet tea.   Drink at least 1 glass of water with each meal and aim for at least 8 glasses per day  Exercise at least 150 minutes every week.    Preventive Care 49-33 Years Old, Male Preventive care refers to lifestyle choices and visits with your health care provider that can promote health and wellness. Preventive care visits are also called wellness exams. What can I expect for my preventive care visit? Counseling During your preventive care visit, your health care provider may ask about your: Medical history, including: Past medical problems. Family medical history. Current health, including: Emotional well-being. Home life and relationship well-being. Sexual activity. Lifestyle, including: Alcohol, nicotine or tobacco, and drug use. Access to firearms. Diet, exercise, and sleep  habits. Safety issues such as seatbelt and bike helmet use. Sunscreen use. Work and work Astronomer. Physical exam Your health care provider may check your: Height and weight. These may be used to calculate your BMI (body mass index). BMI is a measurement that tells if you are at a healthy weight. Waist circumference. This measures the distance around your waistline. This measurement also tells if you are at a healthy weight and may help predict your risk of certain diseases, such as type 2 diabetes and high blood pressure. Heart rate and blood pressure. Body temperature. Skin for abnormal spots. What immunizations do I need? Vaccines are usually given at various ages, according to a schedule. Your health care provider will recommend vaccines for you based on your age, medical history, and lifestyle or other factors, such as travel or where you work. What tests do I need? Screening Your health care provider may recommend screening tests for certain conditions. This may include: Lipid and cholesterol levels. Diabetes screening. This is done by checking your blood sugar (glucose) after you have not eaten for a while (fasting). Hepatitis B test. Hepatitis C test. HIV (human immunodeficiency virus) test. STI (sexually transmitted infection) testing, if you are at risk. Talk with your health care provider about your test results, treatment options, and if necessary, the need for more tests. Follow these instructions at home: Eating and drinking  Eat a healthy diet that includes fresh fruits and  vegetables, whole grains, lean protein, and low-fat dairy products. Drink enough fluid to keep your urine pale yellow. Take vitamin and mineral supplements as recommended by your health care provider. Do not drink alcohol if your health care provider tells you not to drink. If you drink alcohol: Limit how much you have to 0-2 drinks a day. Know how much alcohol is in your drink. In the [place] one  drink equals one 12 oz bottle of beer (355 mL), one 5 oz glass of wine (148 mL), or one 1 oz glass of hard liquor (44 mL). Lifestyle Brush your teeth every morning and night with fluoride toothpaste. Floss one time each day. Exercise for at least 30 minutes 5 or more days each week. Do not use any products that contain nicotine or tobacco. These products include cigarettes, chewing tobacco, and vaping devices, such as e-cigarettes. If you need help quitting, ask your health care provider. Do not use drugs. If you are sexually active, practice safe sex. Use a condom or other form of protection to prevent STIs. Find healthy ways to manage stress, such as: Meditation, yoga, or listening to music. Journaling. Talking to a trusted person. Spending time with friends and family. Minimize exposure to UV radiation to reduce your risk of skin cancer. Safety Always wear your seat belt while driving or riding in a vehicle. Do not drive: If you have been drinking alcohol. Do not ride with someone who has been drinking. If you have been using any mind-altering substances or drugs. While texting. When you are tired or distracted. Wear a helmet and other protective equipment during sports activities. If you have firearms in your house, make sure you follow all gun safety procedures. Seek help if you have been physically or sexually abused. What's next? Go to your health care provider once a year for an annual wellness visit. Ask your health care provider how often you should have your eyes and teeth checked. Stay up to date on all vaccines. This information is not intended to replace advice given to you by your health care provider. Make sure you discuss any questions you have with your health care provider. Document Revised: 12/12/2020 Document Reviewed: 12/12/2020 Elsevier Patient Education  2022 [employer].

## 2021-05-18 LAB — COMPREHENSIVE METABOLIC PANEL
AG Ratio: 1.8 (calc) (ref 1.0–2.5)
ALT: 12 U/L (ref 9–46)
AST: 13 U/L (ref 10–40)
Albumin: 4.7 g/dL (ref 3.6–5.1)
Alkaline phosphatase (APISO): 43 U/L (ref 36–130)
BUN: 20 mg/dL (ref 7–25)
CO2: 27 mmol/L (ref 20–32)
Calcium: 9.9 mg/dL (ref 8.6–10.3)
Chloride: 103 mmol/L (ref 98–110)
Creat: 1.22 mg/dL (ref 0.60–1.24)
Globulin: 2.6 g/dL (calc) (ref 1.9–3.7)
Glucose, Bld: 119 mg/dL — ABNORMAL HIGH (ref 65–99)
Potassium: 3.6 mmol/L (ref 3.5–5.3)
Sodium: 140 mmol/L (ref 135–146)
Total Bilirubin: 1.8 mg/dL — ABNORMAL HIGH (ref 0.2–1.2)
Total Protein: 7.3 g/dL (ref 6.1–8.1)

## 2021-05-18 LAB — CBC
HCT: 45.1 % (ref 38.5–50.0)
Hemoglobin: 15.8 g/dL (ref 13.2–17.1)
MCH: 29.2 pg (ref 27.0–33.0)
MCHC: 35 g/dL (ref 32.0–36.0)
MCV: 83.2 fL (ref 80.0–100.0)
MPV: 10.1 fL (ref 7.5–12.5)
Platelets: 196 10*3/uL (ref 140–400)
RBC: 5.42 10*6/uL (ref 4.20–5.80)
RDW: 13.2 % (ref 11.0–15.0)
WBC: 5.8 10*3/uL (ref 3.8–10.8)

## 2021-05-18 LAB — HEMOGLOBIN A1C
Hgb A1c MFr Bld: 5.3 % of total Hgb (ref <5.7)
Mean Plasma Glucose: 105 mg/dL
eAG (mmol/L): 5.8 mmol/L

## 2021-05-18 LAB — LIPID PANEL
Cholesterol: 167 mg/dL (ref <200)
HDL: 50 mg/dL (ref >=40)
LDL Cholesterol (Calc): 103 mg/dL (calc) — ABNORMAL HIGH
Non-HDL Cholesterol (Calc): 117 mg/dL (calc) (ref <130)
Total CHOL/HDL Ratio: 3.3 (calc) (ref <5.0)
Triglycerides: 59 mg/dL (ref <150)

## 2021-05-18 LAB — TSH: TSH: 1.69 mIU/L (ref 0.40–4.50)

## 2021-05-21 NOTE — Progress Notes (Signed)
Please inform patient of the following:  His bilirubin level is up a little. This is likely a benign hereditary condition but recommend he come back in to check fractionated bilirubin level. Please place future order.   His cholesterol level is up a little since last year but overall stable. We can recheck in a year. It is ok to complete his form for work.  Katina Degree. [name], MD 05/21/2021 10:35 AM

## 2021-05-22 ENCOUNTER — Other Ambulatory Visit: Payer: Self-pay | Admitting: *Deleted

## 2021-05-22 DIAGNOSIS — R17 Unspecified jaundice: Secondary | ICD-10-CM

## 2021-05-29 ENCOUNTER — Other Ambulatory Visit: Payer: BC Managed Care – PPO

## 2021-05-29 DIAGNOSIS — R17 Unspecified jaundice: Secondary | ICD-10-CM

## 2021-05-29 LAB — EXTRA SPECIMEN

## 2021-05-29 LAB — BILIRUBIN, FRACTIONATED(TOT/DIR/INDIR)

## 2021-06-05 ENCOUNTER — Encounter: Payer: Self-pay | Admitting: Family Medicine

## 2021-06-05 NOTE — Telephone Encounter (Signed)
See note

## 2021-06-07 NOTE — Telephone Encounter (Signed)
Send information to lab

## 2021-06-10 ENCOUNTER — Other Ambulatory Visit: Payer: Self-pay | Admitting: *Deleted

## 2021-06-10 DIAGNOSIS — R17 Unspecified jaundice: Secondary | ICD-10-CM

## 2021-06-10 NOTE — Telephone Encounter (Signed)
Per Melissa, Lab Tech, correct Tubes was  send. Unsure why test was cancelled

## 2021-06-10 NOTE — Telephone Encounter (Signed)
Patient requesting for lab to be done, lab order appointment schedule

## 2021-06-13 ENCOUNTER — Other Ambulatory Visit: Payer: BC Managed Care – PPO

## 2021-06-13 ENCOUNTER — Other Ambulatory Visit: Payer: Self-pay

## 2021-06-13 DIAGNOSIS — R17 Unspecified jaundice: Secondary | ICD-10-CM | POA: Diagnosis not present

## 2021-06-13 LAB — BILIRUBIN, FRACTIONATED(TOT/DIR/INDIR)
Bilirubin, Direct: 0.3 mg/dL — ABNORMAL HIGH (ref 0.0–0.2)
Indirect Bilirubin: 1.7 mg/dL (calc) — ABNORMAL HIGH (ref 0.2–1.2)
Total Bilirubin: 2 mg/dL — ABNORMAL HIGH (ref 0.2–1.2)

## 2021-06-14 ENCOUNTER — Encounter: Payer: Self-pay | Admitting: Family Medicine

## 2021-06-14 NOTE — Progress Notes (Signed)
Please inform patient of the following:  His bilirubin levels are stable. He has a benign condition called Gilbert's syndrome. This is nothing to be concerned about and should not cause him any issues. We can check his bilirubin level yearly but do not need to do any further testing at this point.

## 2022-04-14 ENCOUNTER — Encounter: Payer: BC Managed Care – PPO | Admitting: Family Medicine

## 2022-05-07 ENCOUNTER — Ambulatory Visit (INDEPENDENT_AMBULATORY_CARE_PROVIDER_SITE_OTHER): Payer: BC Managed Care – PPO | Admitting: Family Medicine

## 2022-05-07 ENCOUNTER — Encounter: Payer: Self-pay | Admitting: Family Medicine

## 2022-05-07 VITALS — BP 114/69 | HR 62 | Temp 98.0°F | Ht 75.0 in | Wt 192.8 lb

## 2022-05-07 DIAGNOSIS — G43809 Other migraine, not intractable, without status migrainosus: Secondary | ICD-10-CM

## 2022-05-07 DIAGNOSIS — Z0001 Encounter for general adult medical examination with abnormal findings: Secondary | ICD-10-CM

## 2022-05-07 DIAGNOSIS — Z13220 Encounter for screening for lipoid disorders: Secondary | ICD-10-CM | POA: Diagnosis not present

## 2022-05-07 DIAGNOSIS — I861 Scrotal varices: Secondary | ICD-10-CM

## 2022-05-07 LAB — LIPID PANEL
Cholesterol: 178 mg/dL (ref 0–200)
HDL: 50.8 mg/dL (ref >39.00)
LDL Cholesterol: 115 mg/dL — ABNORMAL HIGH (ref 0–99)
NonHDL: 127.45
Total CHOL/HDL Ratio: 4
Triglycerides: 64 mg/dL (ref 0.0–149.0)
VLDL: 12.8 mg/dL (ref 0.0–40.0)

## 2022-05-07 NOTE — Patient Instructions (Signed)
It was very nice to see you today!  I will refer you to urology for varicocele.  I think you are probably having PVCs.  These are benign.  Please let us know if he would like for [place] to get a heart monitor.  Please continue to work on diet and exercise.  We will see back in year.  Come back sooner if needed.  Take care, Dr [name]  PLEASE NOTE:  If you had any lab tests please let us know if you have not heard back within a few days. You may see your results on mychart before we have a chance to review them but we will give you a call once they are reviewed by [place]. If we ordered any referrals today, please let us know if you have not heard from their office within the next week.   Please try these tips to maintain a healthy lifestyle:  Eat at least 3 REAL meals and 1-2 snacks per day.  Aim for no more than 5 hours between eating.  If you eat breakfast, please do so within one hour of getting up.   Each meal should contain half fruits/vegetables, one quarter protein, and one quarter carbs (no bigger than a computer mouse)  Cut down on sweet beverages. This includes juice, soda, and sweet tea.   Drink at least 1 glass of water with each meal and aim for at least 8 glasses per day  Exercise at least 150 minutes every week.    Preventive Care 23-33 Years Old, Male Preventive care refers to lifestyle choices and visits with your health care provider that can promote health and wellness. Preventive care visits are also called wellness exams. What can I expect for my preventive care visit? Counseling During your preventive care visit, your health care provider may ask about your: Medical history, including: Past medical problems. Family medical history. Current health, including: Emotional well-being. Home life and relationship well-being. Sexual activity. Lifestyle, including: Alcohol, nicotine or tobacco, and drug use. Access to firearms. Diet, exercise, and sleep habits. Safety  issues such as seatbelt and bike helmet use. Sunscreen use. Work and work Astronomer. Physical exam Your health care provider may check your: Height and weight. These may be used to calculate your BMI (body mass index). BMI is a measurement that tells if you are at a healthy weight. Waist circumference. This measures the distance around your waistline. This measurement also tells if you are at a healthy weight and may help predict your risk of certain diseases, such as type 2 diabetes and high blood pressure. Heart rate and blood pressure. Body temperature. Skin for abnormal spots. What immunizations do I need?  Vaccines are usually given at various ages, according to a schedule. Your health care provider will recommend vaccines for you based on your age, medical history, and lifestyle or other factors, such as travel or where you work. What tests do I need? Screening Your health care provider may recommend screening tests for certain conditions. This may include: Lipid and cholesterol levels. Diabetes screening. This is done by checking your blood sugar (glucose) after you have not eaten for a while (fasting). Hepatitis B test. Hepatitis C test. HIV (human immunodeficiency virus) test. STI (sexually transmitted infection) testing, if you are at risk. Talk with your health care provider about your test results, treatment options, and if necessary, the need for more tests. Follow these instructions at home: Eating and drinking  Eat a healthy diet that includes fresh fruits  and vegetables, whole grains, lean protein, and low-fat dairy products. Drink enough fluid to keep your urine pale yellow. Take vitamin and mineral supplements as recommended by your health care provider. Do not drink alcohol if your health care provider tells you not to drink. If you drink alcohol: Limit how much you have to 0-2 drinks a day. Know how much alcohol is in your drink. In the [place] one drink equals one  12 oz bottle of beer (355 mL), one 5 oz glass of wine (148 mL), or one 1 oz glass of hard liquor (44 mL). Lifestyle Brush your teeth every morning and night with fluoride toothpaste. Floss one time each day. Exercise for at least 30 minutes 5 or more days each week. Do not use any products that contain nicotine or tobacco. These products include cigarettes, chewing tobacco, and vaping devices, such as e-cigarettes. If you need help quitting, ask your health care provider. Do not use drugs. If you are sexually active, practice safe sex. Use a condom or other form of protection to prevent STIs. Find healthy ways to manage stress, such as: Meditation, yoga, or listening to music. Journaling. Talking to a trusted person. Spending time with friends and family. Minimize exposure to UV radiation to reduce your risk of skin cancer. Safety Always wear your seat belt while driving or riding in a vehicle. Do not drive: If you have been drinking alcohol. Do not ride with someone who has been drinking. If you have been using any mind-altering substances or drugs. While texting. When you are tired or distracted. Wear a helmet and other protective equipment during sports activities. If you have firearms in your house, make sure you follow all gun safety procedures. Seek help if you have been physically or sexually abused. What's next? Go to your health care provider once a year for an annual wellness visit. Ask your health care provider how often you should have your eyes and teeth checked. Stay up to date on all vaccines. This information is not intended to replace advice given to you by your health care provider. Make sure you discuss any questions you have with your health care provider. Document Revised: 12/12/2020 Document Reviewed: 12/12/2020 Elsevier Patient Education  2023 [employer]   Premature Ventricular Contraction  A premature ventricular contraction (PVC) is a type of irregular  heartbeat (arrhythmia). The heart has four chambers, including the upper chambers (atria) and lower chambers (ventricles). Normally, an electrical signal starts in a group of cells called the sinoatrial node (SA node) and travels through the atria, causing them to pump blood into the ventricles. During a PVC, the heartbeat starts in one of the lower ventricles. This may cause the heartbeat to be shorter and less effective. In most cases, PVCs come and go and do not require treatment.  What increases the risk? The following factors may make you more likely to develop this condition: Age, especially being over age 10. Being male. An imbalance of salts and minerals in the body (electrolytes). Low blood oxygen levels or high carbon dioxide levels. Certain medicines, including over-the-counter and prescribed medicines. High blood pressure. Obesity. Episodes may be triggered by: Vigorous exercise. Tobacco, alcohol, or caffeine use. Illegal drug use. Emotional stress. Poor or irregular sleep. What are the signs or symptoms? The main symptoms of this condition are fast or irregular heartbeats (palpitations) or the feeling of a pause in the heartbeat. Other symptoms include: Shortness of breath. Difficulty exercising. Chest pain. Feeling tired. Dizziness. In some  cases, there are no symptoms. How is this diagnosed? This condition may be diagnosed based on: Your medical history or symptoms. A physical exam. Your health care provider may listen to your heart. Tests, such as: Blood tests. An ECG (electrocardiogram) to monitor the electrical activity of your heart. An ambulatory cardiac monitor that records your heartbeats for 24 hours or more. Stress tests to see how exercise affects your heart rhythm and blood supply. An echocardiogram, which creates an image of your heart. An electrophysiology study (EPS) to check for electrical problems in your heart. How is this treated? Treatment for  this condition depends on any underlying conditions, the type of PVC, how many PVCs you have had, and if the symptoms are affecting your daily life. Possible treatments include: Avoiding things that cause PVCs (triggers). These include caffeine, tobacco, and alcohol. Taking medicines if symptoms are severe or if the arrhythmias happen a lot. Getting treatment for underlying conditions that cause PVCs. Having an implantable cardioverter defibrillator (ICD) placed in the chest to monitor the heartbeat. The monitor sends a shock to the heart if it senses an arrhythmia and brings the heartbeat back to normal. Having a catheter ablation procedure to destroy the part of the heart tissue that sends abnormal signals. In many cases, no treatment is required. Follow these instructions at home: Lifestyle  Do not use any products that contain nicotine or tobacco. These products include cigarettes, chewing tobacco, and vaping devices, such as e-cigarettes. If you need help quitting, ask your health care provider. Do not use illegal drugs. Exercise regularly. Ask your health care provider what type of exercise is safe for you. Try to get at least 7-9 hours of sleep each night. Find healthy ways to manage stress. Avoid stressful situations when possible. Alcohol use Do not drink alcohol if: Your health care provider tells you not to drink. You are pregnant, may be pregnant, or are planning to become pregnant. Alcohol triggers your episodes. If you drink alcohol: Limit how much you have to: 0-1 drink a day for women. 0-2 drinks a day for men. Know how much alcohol is in your drink. In the [place] one drink equals one 12 oz bottle of beer (355 mL), one 5 oz glass of wine (148 mL), or one 1 oz glass of hard liquor (44 mL). General instructions Take over-the-counter and prescription medicines only as told by your health care provider. If caffeine triggers episodes of PVC, do not eat, drink, or use anything  with caffeine in it. Contact a health care provider if: You feel palpitations often. You have nausea and vomiting. Get help right away if: You have chest pain. You have trouble breathing. You start sweating for no reason. You become light-headed or you faint. These symptoms may be an emergency. Get help right away. Call 911. Do not wait to see if the symptoms will go away. Do not drive yourself to the hospital. This information is not intended to replace advice given to you by your health care provider. Make sure you discuss any questions you have with your health care provider. Document Revised: 11/15/2021 Document Reviewed: 11/15/2021 Elsevier Patient Education  2023 [employer].

## 2022-05-07 NOTE — Progress Notes (Signed)
Chief Complaint:  Juan Krueger is a 26 y.o. male who presents today for his annual comprehensive physical exam.    Assessment/Plan:  New/Acute Problems: Palpitations No red flags.  Based on history consistent with PVCs or PACs.  He is not currently having any symptoms.  We discussed low utility for EKG today.  We did discuss Holter monitor however deferred for now.  We will continue with watchful waiting.  He will let us know if symptoms change or if he would like to proceed with Holter monitor.  We discussed reasons return to care.  Chronic Problems Addressed Today: Migraine Symptoms are stable.  Has only used Imitrex a couple of times.  Varicocele Varicocele has become larger and more painful.  We will place referral to urology for further evaluation management.  Preventative Healthcare: Check lipids.  Patient Counseling(The following topics were reviewed and/or handout was given):  -Nutrition: Stressed importance of moderation in sodium/caffeine intake, saturated fat and cholesterol, caloric balance, sufficient intake of fresh fruits, vegetables, and fiber.  -Stressed the importance of regular exercise.   -Substance Abuse: Discussed cessation/primary prevention of tobacco, alcohol, or other drug use; driving or other dangerous activities under the influence; availability of treatment for abuse.   -Injury prevention: Discussed safety belts, safety helmets, smoke detector, smoking near bedding or upholstery.   -Sexuality: Discussed sexually transmitted diseases, partner selection, use of condoms, avoidance of unintended pregnancy and contraceptive alternatives.   -Dental health: Discussed importance of regular tooth brushing, flossing, and dental visits.  -Health maintenance and immunizations reviewed. Please refer to Health maintenance section.  Return to care in 1 year for next preventative visit.     Subjective:  HPI:  See A/p for status of chronic conditions.  He has  been having palpitations for the last 6-12 months. Happens a couple of times day. Happens every day.  No obvious triggers.  He has been trying to avoid caffeine and alcohol.  Lifestyle Diet: Balanced. Plenty of fruits and vegetables.  Exercise: None specific.      05/07/2022    7:38 AM  Depression screen PHQ 2/9  Decreased Interest 0  Down, Depressed, Hopeless 0  PHQ - 2 Score 0    Health Maintenance Due  Topic Date Due   COVID-19 Vaccine (1) Never done   HPV VACCINES (1 - Male 2-dose series) Never done     ROS: Per HPI, otherwise a complete review of systems was negative.   PMH:  The following were reviewed and entered/updated in epic: History reviewed. No pertinent past medical history. Patient Active Problem List   Diagnosis Date Noted   Rosanna Randy syndrome 06/14/2021   Migraine 05/17/2021   Varicocele 05/11/2020   Past Surgical History:  Procedure Laterality Date   SKIN SURGERY     TONSILLECTOMY AND ADENOIDECTOMY      Family History  Problem Relation Age of Onset   Skin cancer Mother    Skin cancer Father    Prostate cancer Paternal Grandmother     Medications- reviewed and updated Current Outpatient Medications  Medication Sig Dispense Refill   cyclobenzaprine (FLEXERIL) 10 MG tablet Take 1 tablet (10 mg total) by mouth 3 (three) times daily as needed for muscle spasms. 30 tablet 0   SUMAtriptan (IMITREX) 50 MG tablet Take 1 tablet (50 mg total) by mouth every 2 (two) hours as needed for migraine. May repeat in 2 hours if headache persists or recurs. 10 tablet 0   No current facility-administered medications for this visit.  Allergies-reviewed and updated No Known Allergies  Social History   Socioeconomic History   Marital status: Single    Spouse name: Not on file   Number of children: Not on file   Years of education: Not on file   Highest education level: Not on file  Occupational History   Not on file  Tobacco Use   Smoking status: Never    Smokeless tobacco: Never  Vaping Use   Vaping Use: Never used  Substance and Sexual Activity   Alcohol use: Never   Drug use: Never   Sexual activity: Not on file  Other Topics Concern   Not on file  Social History Narrative   Not on file   Social Determinants of Health   Financial Resource Strain: Not on file  Food Insecurity: Not on file  Transportation Needs: Not on file  Physical Activity: Not on file  Stress: Not on file  Social Connections: Not on file        Objective:  Physical Exam: BP 114/69   Pulse 62   Temp 98 F (36.7 C) (Temporal)   Ht 6\' 3"  (1.905 m)   Wt 192 lb 12.8 oz (87.5 kg)   SpO2 99%   BMI 24.10 kg/m   Body mass index is 24.1 kg/m. Wt Readings from Last 3 Encounters:  05/07/22 192 lb 12.8 oz (87.5 kg)  05/17/21 194 lb 12.8 oz (88.4 kg)  05/23/20 200 lb 6.4 oz (90.9 kg)   Gen: NAD, resting comfortably HEENT: TMs normal bilaterally. OP clear. No thyromegaly noted.  CV: RRR with no murmurs appreciated Pulm: NWOB, CTAB with no crackles, wheezes, or rhonchi GI: Normal bowel sounds present. Soft, Nontender, Nondistended. MSK: no edema, cyanosis, or clubbing noted Skin: warm, dry Neuro: CN2-12 grossly intact. Strength 5/5 in upper and lower extremities. Reflexes symmetric and intact bilaterally.  Psych: Normal affect and thought content     Antonia Jicha M. 05/25/20, MD 05/07/2022 8:17 AM

## 2022-05-07 NOTE — Assessment & Plan Note (Signed)
Symptoms are stable.  Has only used Imitrex a couple of times.

## 2022-05-07 NOTE — Assessment & Plan Note (Signed)
Varicocele has become larger and more painful.  We will place referral to urology for further evaluation management.

## 2022-05-09 ENCOUNTER — Telehealth: Payer: Self-pay | Admitting: *Deleted

## 2022-05-09 NOTE — Telephone Encounter (Signed)
CPE form email to [email or web address]

## 2022-05-12 NOTE — Progress Notes (Signed)
Please inform patient of the following:  Cholesterol levels are stable compared to previous.  It is okay to complete his work form.

## 2023-02-27 DIAGNOSIS — Q639 Congenital malformation of kidney, unspecified: Secondary | ICD-10-CM | POA: Diagnosis not present

## 2023-02-27 DIAGNOSIS — R8290 Unspecified abnormal findings in urine: Secondary | ICD-10-CM | POA: Diagnosis not present

## 2023-02-27 DIAGNOSIS — I861 Scrotal varices: Secondary | ICD-10-CM | POA: Diagnosis not present

## 2023-04-26 DIAGNOSIS — Z3141 Encounter for fertility testing: Secondary | ICD-10-CM | POA: Diagnosis not present

## 2023-06-09 ENCOUNTER — Encounter: Payer: BC Managed Care – PPO | Admitting: Family Medicine

## 2023-06-15 ENCOUNTER — Ambulatory Visit (INDEPENDENT_AMBULATORY_CARE_PROVIDER_SITE_OTHER): Payer: BC Managed Care – PPO | Admitting: Family Medicine

## 2023-06-15 ENCOUNTER — Encounter: Payer: Self-pay | Admitting: Family Medicine

## 2023-06-15 VITALS — BP 130/70 | HR 82 | Temp 97.3°F | Ht 76.0 in | Wt 191.4 lb

## 2023-06-15 DIAGNOSIS — Z0001 Encounter for general adult medical examination with abnormal findings: Secondary | ICD-10-CM

## 2023-06-15 DIAGNOSIS — G43809 Other migraine, not intractable, without status migrainosus: Secondary | ICD-10-CM | POA: Diagnosis not present

## 2023-06-15 DIAGNOSIS — I861 Scrotal varices: Secondary | ICD-10-CM | POA: Diagnosis not present

## 2023-06-15 NOTE — Assessment & Plan Note (Signed)
Symptoms are stable.  He is only had a few migraines over the last couple of years.  He can continue taking Imitrex as needed.

## 2023-06-15 NOTE — Assessment & Plan Note (Signed)
Saw urology for this.  Symptoms are currently manageable.  He will follow-up with them if anything changes.

## 2023-06-15 NOTE — Patient Instructions (Signed)
It was very nice to see you today!  I am glad that you are doing well!  Please keep up the great work with diet and exercise!  We will complete your form today.  I will see you back in year for your next physical.  Come back sooner if needed.  Return in about 1 year (around 06/14/2024) for Annual Physical.   Take care, Dr [name]  PLEASE NOTE:  If you had any lab tests, please let us know if you have not heard back within a few days. You may see your results on mychart before we have a chance to review them but we will give you a call once they are reviewed by [place].   If we ordered any referrals today, please let us know if you have not heard from their office within the next week.   If you had any urgent prescriptions sent in today, please check with the pharmacy within an hour of our visit to make sure the prescription was transmitted appropriately.   Please try these tips to maintain a healthy lifestyle:  Eat at least 3 REAL meals and 1-2 snacks per day.  Aim for no more than 5 hours between eating.  If you eat breakfast, please do so within one hour of getting up.   Each meal should contain half fruits/vegetables, one quarter protein, and one quarter carbs (no bigger than a computer mouse)  Cut down on sweet beverages. This includes juice, soda, and sweet tea.   Drink at least 1 glass of water with each meal and aim for at least 8 glasses per day  Exercise at least 150 minutes every week.    Preventive Care 81-41 Years Old, Male Preventive care refers to lifestyle choices and visits with your health care provider that can promote health and wellness. Preventive care visits are also called wellness exams. What can I expect for my preventive care visit? Counseling During your preventive care visit, your health care provider may ask about your: Medical history, including: Past medical problems. Family medical history. Current health, including: Emotional well-being. Home  life and relationship well-being. Sexual activity. Lifestyle, including: Alcohol, nicotine or tobacco, and drug use. Access to firearms. Diet, exercise, and sleep habits. Safety issues such as seatbelt and bike helmet use. Sunscreen use. Work and work Astronomer. Physical exam Your health care provider may check your: Height and weight. These may be used to calculate your BMI (body mass index). BMI is a measurement that tells if you are at a healthy weight. Waist circumference. This measures the distance around your waistline. This measurement also tells if you are at a healthy weight and may help predict your risk of certain diseases, such as type 2 diabetes and high blood pressure. Heart rate and blood pressure. Body temperature. Skin for abnormal spots. What immunizations do I need?  Vaccines are usually given at various ages, according to a schedule. Your health care provider will recommend vaccines for you based on your age, medical history, and lifestyle or other factors, such as travel or where you work. What tests do I need? Screening Your health care provider may recommend screening tests for certain conditions. This may include: Lipid and cholesterol levels. Diabetes screening. This is done by checking your blood sugar (glucose) after you have not eaten for a while (fasting). Hepatitis B test. Hepatitis C test. HIV (human immunodeficiency virus) test. STI (sexually transmitted infection) testing, if you are at risk. Talk with your health care provider about  your test results, treatment options, and if necessary, the need for more tests. Follow these instructions at home: Eating and drinking  Eat a healthy diet that includes fresh fruits and vegetables, whole grains, lean protein, and low-fat dairy products. Drink enough fluid to keep your urine pale yellow. Take vitamin and mineral supplements as recommended by your health care provider. Do not drink alcohol if your  health care provider tells you not to drink. If you drink alcohol: Limit how much you have to 0-2 drinks a day. Know how much alcohol is in your drink. In the [place] one drink equals one 12 oz bottle of beer (355 mL), one 5 oz glass of wine (148 mL), or one 1 oz glass of hard liquor (44 mL). Lifestyle Brush your teeth every morning and night with fluoride toothpaste. Floss one time each day. Exercise for at least 30 minutes 5 or more days each week. Do not use any products that contain nicotine or tobacco. These products include cigarettes, chewing tobacco, and vaping devices, such as e-cigarettes. If you need help quitting, ask your health care provider. Do not use drugs. If you are sexually active, practice safe sex. Use a condom or other form of protection to prevent STIs. Find healthy ways to manage stress, such as: Meditation, yoga, or listening to music. Journaling. Talking to a trusted person. Spending time with friends and family. Minimize exposure to UV radiation to reduce your risk of skin cancer. Safety Always wear your seat belt while driving or riding in a vehicle. Do not drive: If you have been drinking alcohol. Do not ride with someone who has been drinking. If you have been using any mind-altering substances or drugs. While texting. When you are tired or distracted. Wear a helmet and other protective equipment during sports activities. If you have firearms in your house, make sure you follow all gun safety procedures. Seek help if you have been physically or sexually abused. What's next? Go to your health care provider once a year for an annual wellness visit. Ask your health care provider how often you should have your eyes and teeth checked. Stay up to date on all vaccines. This information is not intended to replace advice given to you by your health care provider. Make sure you discuss any questions you have with your health care provider. Document Revised:  12/12/2020 Document Reviewed: 12/12/2020 Elsevier Patient Education  2024 [employer].

## 2023-06-15 NOTE — Progress Notes (Signed)
Chief Complaint:  Juan Krueger is a 27 y.o. male who presents today for his annual comprehensive physical exam.    Assessment/Plan:  Chronic Problems Addressed Today: Varicocele Saw urology for this.  Symptoms are currently manageable.  He will follow-up with them if anything changes.  Migraine Symptoms are stable.  He is only had a few migraines over the last couple of years.  He can continue taking Imitrex as needed.  Preventative Healthcare: Form for insurance completed today.  We did discuss Tdap which he is due for however we will be deferred for today.  We can readdress at next office visit or he can get this at the pharmacy.  Patient Counseling(The following topics were reviewed and/or handout was given):  -Nutrition: Stressed importance of moderation in sodium/caffeine intake, saturated fat and cholesterol, caloric balance, sufficient intake of fresh fruits, vegetables, and fiber.  -Stressed the importance of regular exercise.   -Substance Abuse: Discussed cessation/primary prevention of tobacco, alcohol, or other drug use; driving or other dangerous activities under the influence; availability of treatment for abuse.   -Injury prevention: Discussed safety belts, safety helmets, smoke detector, smoking near bedding or upholstery.   -Sexuality: Discussed sexually transmitted diseases, partner selection, use of condoms, avoidance of unintended pregnancy and contraceptive alternatives.   -Dental health: Discussed importance of regular tooth brushing, flossing, and dental visits.  -Health maintenance and immunizations reviewed. Please refer to Health maintenance section.  Return to care in 1 year for next preventative visit.     Subjective:  HPI:  He has no acute complaints today. He was seen by urology a few months ago for varicocele. Had a reassuring work up including ultrasound and semen analysis.  Symptoms are currently manageable.  He is holding off on further intervention  at this point.  Lifestyle Diet:  Balanced. Plenty fruits and vegetables.  Exercise: Limited.      06/15/2023    2:08 PM  Depression screen PHQ 2/9  Decreased Interest 0  Down, Depressed, Hopeless 0  PHQ - 2 Score 0  Altered sleeping 0  Tired, decreased energy 0  Change in appetite 0  Feeling bad or failure about yourself  0  Trouble concentrating 0  Moving slowly or fidgety/restless 0  Suicidal thoughts 0  PHQ-9 Score 0    Health Maintenance Due  Topic Date Due   DTaP/Tdap/Td (1 - Tdap) Never done     ROS: Per HPI, otherwise a complete review of systems was negative.   PMH:  The following were reviewed and entered/updated in epic: History reviewed. No pertinent past medical history. Patient Active Problem List   Diagnosis Date Noted   S[place]one syndrome 06/14/2021   Migraine 05/17/2021   Varicocele 05/11/2020   Past Surgical History:  Procedure Laterality Date   SKIN SURGERY     TONSILLECTOMY AND ADENOIDECTOMY      Family History  Problem Relation Age of Onset   Skin cancer Mother    Skin cancer Father    Prostate cancer Paternal Grandmother     Medications- reviewed and updated Current Outpatient Medications  Medication Sig Dispense Refill   cyclobenzaprine (FLEXERIL) 10 MG tablet Take 1 tablet (10 mg total) by mouth 3 (three) times daily as needed for muscle spasms. 30 tablet 0   SUMAtriptan (IMITREX) 50 MG tablet Take 1 tablet (50 mg total) by mouth every 2 (two) hours as needed for migraine. May repeat in 2 hours if headache persists or recurs. 10 tablet 0   No current  facility-administered medications for this visit.    Allergies-reviewed and updated No Known Allergies  Social History   Socioeconomic History   Marital status: Single    Spouse name: Not on file   Number of children: Not on file   Years of education: Not on file   Highest education level: Not on file  Occupational History   Not on file  Tobacco Use   Smoking status: Never    Smokeless tobacco: Never  Vaping Use   Vaping status: Never Used  Substance and Sexual Activity   Alcohol use: Never   Drug use: Never   Sexual activity: Not on file  Other Topics Concern   Not on file  Social History Narrative   Not on file   Social Drivers of Health   Financial Resource Strain: Not on file  Food Insecurity: Not on file  Transportation Needs: Not on file  Physical Activity: Not on file  Stress: Not on file  Social Connections: Not on file        Objective:  Physical Exam: BP 130/70 (BP Location: Left Arm, Patient Position: Sitting, Cuff Size: Normal)   Pulse 82   Temp (!) 97.3 F (36.3 C) (Temporal)   Ht 6\' 4"  (1.93 m)   Wt 191 lb 6.4 oz (86.8 kg)   SpO2 98%   BMI 23.30 kg/m   Body mass index is 23.3 kg/m. Wt Readings from Last 3 Encounters:  06/15/23 191 lb 6.4 oz (86.8 kg)  05/07/22 192 lb 12.8 oz (87.5 kg)  05/17/21 194 lb 12.8 oz (88.4 kg)   Gen: NAD, resting comfortably HEENT: TMs normal bilaterally. OP clear. No thyromegaly noted.  CV: RRR with no murmurs appreciated Pulm: NWOB, CTAB with no crackles, wheezes, or rhonchi GI: Normal bowel sounds present. Soft, Nontender, Nondistended. MSK: no edema, cyanosis, or clubbing noted Skin: warm, dry Neuro: CN2-12 grossly intact. Strength 5/5 in upper and lower extremities. Reflexes symmetric and intact bilaterally.  Psych: Normal affect and thought content     Gicela Schwarting M. [name], MD 06/15/2023 2:41 PM

## 2023-06-19 ENCOUNTER — Telehealth: Payer: Self-pay | Admitting: Family Medicine

## 2023-06-19 NOTE — Telephone Encounter (Signed)
Copied from CRM [id number]. Topic: Medical Record Request - Other >> Jun 19, 2023  2:17 PM Juan Krueger C wrote: Reason for CRM: patient was at the office on Monday 06/15/2023 and he said that copies of paperwork were made. He was wondering if he could get the original of the paperwork back.

## 2023-06-23 NOTE — Telephone Encounter (Signed)
Form send to be scan in patient chart

## 2023-06-26 NOTE — Telephone Encounter (Signed)
Copy printed form chart  Spoke with patient  Stated he received copy of form  Copy discarded, no need of copy at this time

## 2023-07-21 ENCOUNTER — Ambulatory Visit: Payer: BC Managed Care – PPO | Admitting: Family Medicine

## 2023-07-21 ENCOUNTER — Encounter: Payer: Self-pay | Admitting: Family Medicine

## 2023-07-21 VITALS — BP 127/75 | HR 65 | Temp 97.7°F | Ht 76.0 in | Wt 193.8 lb

## 2023-07-21 DIAGNOSIS — R42 Dizziness and giddiness: Secondary | ICD-10-CM | POA: Diagnosis not present

## 2023-07-21 DIAGNOSIS — G43809 Other migraine, not intractable, without status migrainosus: Secondary | ICD-10-CM

## 2023-07-21 MED ORDER — MECLIZINE HCL 25 MG PO TABS: 25.0000 mg | ORAL_TABLET | Freq: Three times a day (TID) | ORAL | 0 refills | Status: DC | PRN

## 2023-07-21 MED ORDER — RIZATRIPTAN BENZOATE 10 MG PO TABS: 10.0000 mg | ORAL_TABLET | ORAL | 0 refills | Status: AC | PRN

## 2023-07-21 NOTE — Assessment & Plan Note (Signed)
Typically symptoms have been stable however he did have a migraine a few days ago in concurrence with his vertigo symptoms.  As above he may be having a vestibular migraine.  He was previously prescribed Imitrex however did not like side effects.  Will try Maxalt instead.  Overall his exam today is reassuring however we are checking a CT scan today as above as well.  If this continues to be an issue will need referral to neurology.

## 2023-07-21 NOTE — Progress Notes (Signed)
   Juan Krueger is a 28 y.o. male who presents today for an office visit.  Assessment/Plan:  New/Acute Problems: Vertigo  Overall reassuring exam.  Likely BPPV given positional nature of symptoms though no significant findings on Dix-Hallpike maneuver today.  He does have a history of headache and may have had a vestibular migraine as well.  Low suspicion for acute intracranial pathology given his relatively mild symptoms and reassuring exam however given concurrent headache we will check head CT scan to rule out other possible causes.  We did discuss Epley maneuvers.  Will start meclizine.  Anticipate that this will improve over the next several days however if symptoms do not improve, would consider referral to neurology, vestibular rehab or ENT.  We discussed reasons to return to care and seek emergent care.  Chronic Problems Addressed Today: Migraine Typically symptoms have been stable however he did have a migraine a few days ago in concurrence with his vertigo symptoms.  As above he may be having a vestibular migraine.  He was previously prescribed Imitrex however did not like side effects.  Will try Maxalt instead.  Overall his exam today is reassuring however we are checking a CT scan today as above as well.  If this continues to be an issue will need referral to neurology.     Subjective:  HPI:  See Assessment / plan for status of chronic conditions.  Patient here with chief complaint of dizziness.  Symptoms started several months ago after being on a cruise.  He felt a "rocking" sensation intermittently for the last few months however symptoms have significantly worsened the last week or so.  He is now experiencing episodes of room spinning sensations.  He has had a couple of occasions of this happening while driving where he felt like he was going to pass out.  Symptoms are worse with motion.  Worse with standing.  He has had a migraine for a couple of days as well.  Did have some  associated nausea with this.  Elevated brain fog as well.  No weakness or numbness.  No issues with fine motor control.  He has never had anything like this in the past.        Objective:  Physical Exam: BP 127/75   Pulse 65   Temp 97.7 F (36.5 C) (Temporal)   Ht 6\' 4"  (1.93 m)   Wt 193 lb 12.8 oz (87.9 kg)   SpO2 100%   BMI 23.59 kg/m   Gen: No acute distress, resting comfortably CV: Regular rate and rhythm with no murmurs appreciated Pulm: Normal work of breathing, clear to auscultation bilaterally with no crackles, wheezes, or rhonchi Neuro: Cranial nerves II through XII intact.  Finger-nose-finger testing intact bilaterally.  No dysmetria.  Reflexes symmetric bilaterally.  Strength 5 out of 5 and symmetric bilaterally.  Sensation to light touch intact throughout. Psych: Normal affect and thought content      Juan Krueger M. [name], MD 07/21/2023 7:56 AM

## 2023-07-21 NOTE — Patient Instructions (Addendum)
It was very nice to see you today!  I think you have vertigo. Please work on the Nurse, learning disability" maneuver maneuvers. Take the meclizine as needed.   We will check a head CT scan.   Return if symptoms worsen or fail to improve.   Take care, Dr [name]  PLEASE NOTE:  If you had any lab tests, please let us know if you have not heard back within a few days. You may see your results on mychart before we have a chance to review them but we will give you a call once they are reviewed by [place].   If we ordered any referrals today, please let us know if you have not heard from their office within the next week.   If you had any urgent prescriptions sent in today, please check with the pharmacy within an hour of our visit to make sure the prescription was transmitted appropriately.   Please try these tips to maintain a healthy lifestyle:  Eat at least 3 REAL meals and 1-2 snacks per day.  Aim for no more than 5 hours between eating.  If you eat breakfast, please do so within one hour of getting up.   Each meal should contain half fruits/vegetables, one quarter protein, and one quarter carbs (no bigger than a computer mouse)  Cut down on sweet beverages. This includes juice, soda, and sweet tea.   Drink at least 1 glass of water with each meal and aim for at least 8 glasses per day  Exercise at least 150 minutes every week.    Benign Positional Vertigo Vertigo is the feeling that you or your surroundings are moving when they are not. Benign positional vertigo is the most common form of vertigo. This is usually a harmless condition (benign). This condition is positional. This means that symptoms are triggered by certain movements and positions. This condition can be dangerous if it occurs while you are doing something that could cause harm to yourself or others. This includes activities such as driving or operating machinery. What are the causes? The inner ear has fluid-filled canals that help your brain  sense movement and balance. When the fluid moves, the brain receives messages about your body's position. With benign positional vertigo, calcium crystals in the inner ear break free and disturb the inner ear area. This causes your brain to receive confusing messages about your body's position. What increases the risk? You are more likely to develop this condition if: You are a woman. You are 62 years of age or older. You have recently had a head injury. You have an inner ear disease. What are the signs or symptoms? Symptoms of this condition usually happen when you move your head or your eyes in different directions. Symptoms may start suddenly and usually last for less than a minute. They include: Loss of balance and falling. Feeling like you are spinning or moving. Feeling like your surroundings are spinning or moving. Nausea and vomiting. Blurred vision. Dizziness. Involuntary eye movement (nystagmus). Symptoms can be mild and cause only minor problems, or they can be severe and interfere with daily life. Episodes of benign positional vertigo may return (recur) over time. Symptoms may also improve over time. How is this diagnosed? This condition may be diagnosed based on: Your medical history. A physical exam of the head, neck, and ears. Positional tests to check for or stimulate vertigo. You may be asked to turn your head and change positions, such as going from sitting to lying  down. A health care provider will watch for symptoms of vertigo. You may be referred to a health care provider who specializes in ear, nose, and throat problems (ENT or otolaryngologist) or a provider who specializes in disorders of the nervous system (neurologist). How is this treated?  This condition may be treated in a session in which your health care provider moves your head in specific positions to help the displaced crystals in your inner ear move. Treatment for this condition may take several sessions.  Surgery may be needed in severe cases, but this is rare. In some cases, benign positional vertigo may resolve on its own in 2-4 weeks. Follow these instructions at home: Safety Move slowly. Avoid sudden body or head movements or certain positions, as told by your health care provider. Avoid driving or operating machinery until your health care provider says it is safe. Avoid doing any tasks that would be dangerous to you or others if vertigo occurs. If you have trouble walking or keeping your balance, try using a cane for stability. If you feel dizzy or unstable, sit down right away. Return to your normal activities as told by your health care provider. Ask your health care provider what activities are safe for you. General instructions Take over-the-counter and prescription medicines only as told by your health care provider. Drink enough fluid to keep your urine pale yellow. Keep all follow-up visits. This is important. Contact a health care provider if: You have a fever. Your condition gets worse or you develop new symptoms. Your family or friends notice any behavioral changes. You have nausea or vomiting that gets worse. You have numbness or a prickling and tingling sensation. Get help right away if you: Have difficulty speaking or moving. Are always dizzy or faint. Develop severe headaches. Have weakness in your legs or arms. Have changes in your hearing or vision. Develop a stiff neck. Develop sensitivity to light. These symptoms may represent a serious problem that is an emergency. Do not wait to see if the symptoms will go away. Get medical help right away. Call your local emergency services (911 in the [place] Do not drive yourself to the hospital. Summary Vertigo is the feeling that you or your surroundings are moving when they are not. Benign positional vertigo is the most common form of vertigo. This condition is caused by calcium crystals in the inner ear that become  displaced. This causes a disturbance in an area of the inner ear that helps your brain sense movement and balance. Symptoms include loss of balance and falling, feeling that you or your surroundings are moving, nausea and vomiting, and blurred vision. This condition can be diagnosed based on symptoms, a physical exam, and positional tests. Follow safety instructions as told by your health care provider and keep all follow-up visits. This is important. This information is not intended to replace advice given to you by your health care provider. Make sure you discuss any questions you have with your health care provider. Document Revised: 01/05/2023 Document Reviewed: 01/05/2023 Elsevier Patient Education  2024 [employer].

## 2023-07-22 ENCOUNTER — Ambulatory Visit (HOSPITAL_BASED_OUTPATIENT_CLINIC_OR_DEPARTMENT_OTHER)
Admission: RE | Admit: 2023-07-22 | Discharge: 2023-07-22 | Disposition: A | Discharge status: Home / Self Care | Payer: BC Managed Care – PPO | Source: Ambulatory Visit | Attending: Family Medicine | Admitting: Family Medicine

## 2023-07-22 ENCOUNTER — Telehealth: Payer: Self-pay

## 2023-07-22 DIAGNOSIS — G43809 Other migraine, not intractable, without status migrainosus: Secondary | ICD-10-CM | POA: Diagnosis not present

## 2023-07-22 DIAGNOSIS — R42 Dizziness and giddiness: Secondary | ICD-10-CM | POA: Diagnosis not present

## 2023-07-22 DIAGNOSIS — G43909 Migraine, unspecified, not intractable, without status migrainosus: Secondary | ICD-10-CM | POA: Diagnosis not present

## 2023-07-22 DIAGNOSIS — R29818 Other symptoms and signs involving the nervous system: Secondary | ICD-10-CM | POA: Diagnosis not present

## 2023-07-22 NOTE — Telephone Encounter (Signed)
Received call from [id number] stating patient was returning Juan Krueger call in regards to referral, please reach back out to patient

## 2023-07-23 ENCOUNTER — Encounter: Payer: Self-pay | Admitting: Family Medicine

## 2023-07-23 NOTE — Progress Notes (Signed)
Great news!  Head CT scan is normal.  He should let us know if symptoms are not improving.

## 2024-02-26 DIAGNOSIS — S61210A Laceration without foreign body of right index finger without damage to nail, initial encounter: Secondary | ICD-10-CM | POA: Diagnosis not present

## 2024-02-26 DIAGNOSIS — Z23 Encounter for immunization: Secondary | ICD-10-CM | POA: Diagnosis not present

## 2024-03-17 ENCOUNTER — Encounter: Payer: Self-pay | Admitting: Family Medicine

## 2024-03-17 ENCOUNTER — Ambulatory Visit: Admitting: Family Medicine

## 2024-03-17 VITALS — BP 116/69 | HR 72 | Temp 98.0°F | Ht 76.0 in | Wt 196.2 lb

## 2024-03-17 DIAGNOSIS — M79644 Pain in right finger(s): Secondary | ICD-10-CM

## 2024-03-17 MED ORDER — CEPHALEXIN 500 MG PO CAPS
500.0000 mg | ORAL_CAPSULE | Freq: Four times a day (QID) | ORAL | 0 refills | Status: AC
Start: 2024-03-17 — End: 2024-03-25

## 2024-03-17 NOTE — Progress Notes (Signed)
   Juan Krueger is a 28 y.o. male who presents today for an office visit.  Assessment/Plan:  Finger laceration / Pain See below procedure note.  Had prolonged wound healing due to prolonged Dermabond retention time.  This was removed today and revealed viable underlying tissue without any signs of infection.  No signs of neurovascular compromise.  Steri-Strips were placed today.  We discussed wound care.  Anticipate that this will continue to heal over the next 1 to 2 weeks.  We discussed reasons to return to care.  Follow-up as needed.     Subjective:  HPI:  See assessment / plan for status of chronic conditions.   Discussed the use of AI scribe software for clinical note transcription with the patient, who gave verbal consent to proceed.  History of Present Illness Juan Krueger is a 28 year old male who presents with a non-healing wound on his right index finger.  Three weeks ago, he sustained a laceration on his right index finger from a broken ceramic dinner plate. He initially sought care at an urgent care facility where the wound was flushed, he received a tetanus shot, and the laceration was sealed with medical glue. The glue was expected to fall off within a week, but it remains intact after three weeks.  He describes minimal pain, occurring only when the finger is bumped, and maintains almost full range of motion. There is a sensation of numbness, particularly at the tip of the finger, which he likens to a blister. He has been keeping the wound covered to protect it. No significant pain, signs of infection, or loss of sensation beyond the described numbness.  He is right-handed. There has been no x-ray performed to date, and he confirms the dish was clean at the time of injury. He has been advised by nurse friends to have the wound evaluated by a doctor.         Objective:  Physical Exam: BP 116/69   Pulse 72   Temp 98 F (36.7 C) (Temporal)   Ht 6' 4 (1.93 m)    Wt 196 lb 3.2 oz (89 kg)   SpO2 100%   BMI 23.88 kg/m   Gen: No acute distress, resting comfortably MUSCULOSKELETAL: - Right Hand: Volar aspect of right distal second digit  approximately 1 cm U shaped laceration with overlying necrotic appearing skin.  Dermabond in place.  Good distal cap refill.  Sensation to light touch intact.  Neuro: Grossly normal, moves all extremities Psych: Normal affect and thought content  Procedure note Verbal consent was obtained.  Above laceration was covered with antibiotic ointment to soften the overlying Dermabond.  After approximately 30 minutes Dermabond was able to be removed without complication.  The superficial layer of epidermis was then removed off which revealed viable granulation tissue and epidermis at the site of laceration.  There was a small amount of necrotic tissue that was debrided.  There was no erythema, purulent drainage, or significant pain.  He had good distal cap refill and sensation to light touch distally.  The area was then bandaged with Steri-Strips.  Time Spent: 45 minutes of total time was spent on the date of the encounter performing the following actions: chart review prior to seeing the patient, obtaining history, performing above procedure and exam, performing a medically necessary exam, counseling on the treatment plan, placing orders, and documenting in our EHR.        Worth HERO. Kennyth, MD 03/17/2024 9:36 AM

## 2024-06-16 ENCOUNTER — Ambulatory Visit: Admitting: Family Medicine

## 2024-06-16 VITALS — BP 102/80 | HR 73 | Temp 98.1°F | Ht 76.0 in | Wt 194.0 lb

## 2024-06-16 DIAGNOSIS — Z131 Encounter for screening for diabetes mellitus: Secondary | ICD-10-CM | POA: Diagnosis not present

## 2024-06-16 DIAGNOSIS — Z13220 Encounter for screening for lipoid disorders: Secondary | ICD-10-CM

## 2024-06-16 DIAGNOSIS — R42 Dizziness and giddiness: Secondary | ICD-10-CM | POA: Diagnosis not present

## 2024-06-16 DIAGNOSIS — Z0001 Encounter for general adult medical examination with abnormal findings: Secondary | ICD-10-CM

## 2024-06-16 DIAGNOSIS — G43809 Other migraine, not intractable, without status migrainosus: Secondary | ICD-10-CM

## 2024-06-16 LAB — COMPREHENSIVE METABOLIC PANEL WITH GFR
ALT: 13 U/L (ref 3–53)
AST: 13 U/L (ref 5–37)
Albumin: 4.8 g/dL (ref 3.5–5.2)
Alkaline Phosphatase: 33 U/L — ABNORMAL LOW (ref 39–117)
BUN: 20 mg/dL (ref 6–23)
CO2: 30 meq/L (ref 19–32)
Calcium: 9.6 mg/dL (ref 8.4–10.5)
Chloride: 103 meq/L (ref 96–112)
Creatinine, Ser: 1.2 mg/dL (ref 0.40–1.50)
GFR: 81.99 mL/min (ref >60.00)
Glucose, Bld: 86 mg/dL (ref 70–99)
Potassium: 4 meq/L (ref 3.5–5.1)
Sodium: 141 meq/L (ref 135–145)
Total Bilirubin: 2.9 mg/dL — ABNORMAL HIGH (ref 0.2–1.2)
Total Protein: 7.3 g/dL (ref 6.0–8.3)

## 2024-06-16 LAB — LIPID PANEL
Cholesterol: 159 mg/dL (ref 28–200)
HDL: 47.6 mg/dL (ref >39.00)
LDL Cholesterol: 101 mg/dL — ABNORMAL HIGH (ref 10–99)
NonHDL: 111.3
Total CHOL/HDL Ratio: 3
Triglycerides: 52 mg/dL (ref 10.0–149.0)
VLDL: 10.4 mg/dL (ref 0.0–40.0)

## 2024-06-16 LAB — TSH: TSH: 1.14 u[IU]/mL (ref 0.35–5.50)

## 2024-06-16 LAB — CBC
HCT: 41.9 % (ref 39.0–52.0)
Hemoglobin: 14.6 g/dL (ref 13.0–17.0)
MCHC: 34.9 g/dL (ref 30.0–36.0)
MCV: 85.6 fl (ref 78.0–100.0)
Platelets: 188 K/uL (ref 150.0–400.0)
RBC: 4.9 Mil/uL (ref 4.22–5.81)
RDW: 13 % (ref 11.5–15.5)
WBC: 5 K/uL (ref 4.0–10.5)

## 2024-06-16 LAB — HEMOGLOBIN A1C: Hgb A1c MFr Bld: 5.3 % (ref 4.6–6.5)

## 2024-06-16 NOTE — Patient Instructions (Signed)
 It was very nice to see you today!  Will check blood work today.  Please continue to work on diet and exercise.  Please let us  know if you would like a referral to see ENT or a vestibular rehab specialist for your vertigo.  I will see you back in 1 year for your next physical.  Please come back sooner if needed.  Return in about 1 year (around 06/16/2025) for Annual Physical.   Take care, Dr Kennyth  PLEASE NOTE:  If you had any lab tests, please let us  know if you have not heard back within a few days. You may see your results on mychart before we have a chance to review them but we will give you a call once they are reviewed by us .   If we ordered any referrals today, please let us  know if you have not heard from their office within the next week.   If you had any urgent prescriptions sent in today, please check with the pharmacy within an hour of our visit to make sure the prescription was transmitted appropriately.   Please try these tips to maintain a healthy lifestyle:  Eat at least 3 REAL meals and 1-2 snacks per day.  Aim for no more than 5 hours between eating.  If you eat breakfast, please do so within one hour of getting up.   Each meal should contain half fruits/vegetables, one quarter protein, and one quarter carbs (no bigger than a computer mouse)  Cut down on sweet beverages. This includes juice, soda, and sweet tea.   Drink at least 1 glass of water with each meal and aim for at least 8 glasses per day  Exercise at least 150 minutes every week.    Preventive Care 29-29 Years Old, Male Preventive care refers to lifestyle choices and visits with your health care provider that can promote health and wellness. Preventive care visits are also called wellness exams. What can I expect for my preventive care visit? Counseling During your preventive care visit, your health care provider may ask about your: Medical history, including: Past medical problems. Family  medical history. Current health, including: Emotional well-being. Home life and relationship well-being. Sexual activity. Lifestyle, including: Alcohol, nicotine or tobacco, and drug use. Access to firearms. Diet, exercise, and sleep habits. Safety issues such as seatbelt and bike helmet use. Sunscreen use. Work and work astronomer. Physical exam Your health care provider may check your: Height and weight. These may be used to calculate your BMI (body mass index). BMI is a measurement that tells if you are at a healthy weight. Waist circumference. This measures the distance around your waistline. This measurement also tells if you are at a healthy weight and may help predict your risk of certain diseases, such as type 2 diabetes and high blood pressure. Heart rate and blood pressure. Body temperature. Skin for abnormal spots. What immunizations do I need?  Vaccines are usually given at various ages, according to a schedule. Your health care provider will recommend vaccines for you based on your age, medical history, and lifestyle or other factors, such as travel or where you work. What tests do I need? Screening Your health care provider may recommend screening tests for certain conditions. This may include: Lipid and cholesterol levels. Diabetes screening. This is done by checking your blood sugar (glucose) after you have not eaten for a while (fasting). Hepatitis B test. Hepatitis C test. HIV (human immunodeficiency virus) test. STI (sexually transmitted infection) testing,  if you are at risk. Talk with your health care provider about your test results, treatment options, and if necessary, the need for more tests. Follow these instructions at home: Eating and drinking  Eat a healthy diet that includes fresh fruits and vegetables, whole grains, lean protein, and low-fat dairy products. Drink enough fluid to keep your urine pale yellow. Take vitamin and mineral supplements as  recommended by your health care provider. Do not drink alcohol if your health care provider tells you not to drink. If you drink alcohol: Limit how much you have to 0-2 drinks a day. Know how much alcohol is in your drink. In the [place] one drink equals one 12 oz bottle of beer (355 mL), one 5 oz glass of wine (148 mL), or one 1 oz glass of hard liquor (44 mL). Lifestyle Brush your teeth every morning and night with fluoride toothpaste. Floss one time each day. Exercise for at least 30 minutes 5 or more days each week. Do not use any products that contain nicotine or tobacco. These products include cigarettes, chewing tobacco, and vaping devices, such as e-cigarettes. If you need help quitting, ask your health care provider. Do not use drugs. If you are sexually active, practice safe sex. Use a condom or other form of protection to prevent STIs. Find healthy ways to manage stress, such as: Meditation, yoga, or listening to music. Journaling. Talking to a trusted person. Spending time with friends and family. Minimize exposure to UV radiation to reduce your risk of skin cancer. Safety Always wear your seat belt while driving or riding in a vehicle. Do not drive: If you have been drinking alcohol. Do not ride with someone who has been drinking. If you have been using any mind-altering substances or drugs. While texting. When you are tired or distracted. Wear a helmet and other protective equipment during sports activities. If you have firearms in your house, make sure you follow all gun safety procedures. Seek help if you have been physically or sexually abused. What's next? Go to your health care provider once a year for an annual wellness visit. Ask your health care provider how often you should have your eyes and teeth checked. Stay up to date on all vaccines. This information is not intended to replace advice given to you by your health care provider. Make sure you discuss any  questions you have with your health care provider. Document Revised: 12/12/2020 Document Reviewed: 12/12/2020 Elsevier Patient Education  2024 [employer].

## 2024-06-16 NOTE — Progress Notes (Signed)
 Chief Complaint:  Juan Krueger is a 28 y.o. male who presents today for his annual comprehensive physical exam.    Assessment/Plan:  Chronic Problems Addressed Today: Vertigo His vertigo symptoms have improved significantly though still does occasionally get mild symptoms.  He has been trying to Epley maneuvers at home with some modest improvement though still getting occasional recurrences.  We did discuss referral to vestibular rehab specialist or ENT however he would like to hold off on this for now.  He will let us  know if he changes his mind or needs any further assistance.  Migraine Overall symptoms are stable on Maxalt  as needed.  Does not need refill today.  Preventative Healthcare: Check labs.  Patient Counseling(The following topics were reviewed and/or handout was given):  -Nutrition: Stressed importance of moderation in sodium/caffeine intake, saturated fat and cholesterol, caloric balance, sufficient intake of fresh fruits, vegetables, and fiber.  -Stressed the importance of regular exercise.   -Substance Abuse: Discussed cessation/primary prevention of tobacco, alcohol, or other drug use; driving or other dangerous activities under the influence; availability of treatment for abuse.   -Injury prevention: Discussed safety belts, safety helmets, smoke detector, smoking near bedding or upholstery.   -Sexuality: Discussed sexually transmitted diseases, partner selection, use of condoms, avoidance of unintended pregnancy and contraceptive alternatives.   -Dental health: Discussed importance of regular tooth brushing, flossing, and dental visits.  -Health maintenance and immunizations reviewed. Please refer to Health maintenance section.  Return to care in 1 year for next preventative visit.     Subjective:  HPI:  He has no acute complaints today. Patient is here today for his annual physical.  See assessment / plan for status of chronic conditions.  Lifestyle Diet:  Overall balanced.  Try to get plenty of fruits and veggies. Exercise: None specific.      06/16/2024    2:05 PM  Depression screen PHQ 2/9  Decreased Interest 0  Down, Depressed, Hopeless 0  PHQ - 2 Score 0    There are no preventive care reminders to display for this patient.   ROS: Per HPI, otherwise a complete review of systems was negative.   PMH:  The following were reviewed and entered/updated in epic: No past medical history on file. Patient Active Problem List   Diagnosis Date Noted   Vertigo 06/16/2024   Bertrum syndrome 06/14/2021   Migraine 05/17/2021   Varicocele 05/11/2020   Past Surgical History:  Procedure Laterality Date   SKIN SURGERY     TONSILLECTOMY AND ADENOIDECTOMY      Family History  Problem Relation Age of Onset   Skin cancer Mother    Skin cancer Father    Prostate cancer Paternal Grandmother     Medications- reviewed and updated Current Outpatient Medications  Medication Sig Dispense Refill   rizatriptan  (MAXALT ) 10 MG tablet Take 1 tablet (10 mg total) by mouth as needed for migraine. May repeat in 2 hours if needed 10 tablet 0   No current facility-administered medications for this visit.    Allergies-reviewed and updated Allergies[1]  Social History   Socioeconomic History   Marital status: Single    Spouse name: Not on file   Number of children: Not on file   Years of education: Not on file   Highest education level: Master's degree (e.g., MA, MS, MEng, MEd, MSW, MBA)  Occupational History   Not on file  Tobacco Use   Smoking status: Never   Smokeless tobacco: Never  Vaping Use  Vaping status: Never Used  Substance and Sexual Activity   Alcohol use: Never   Drug use: Never   Sexual activity: Not on file  Other Topics Concern   Not on file  Social History Narrative   Not on file   Social Drivers of Health   Tobacco Use: Low Risk (03/17/2024)   Patient History    Smoking Tobacco Use: Never    Smokeless  Tobacco Use: Never    Passive Exposure: Not on file  Financial Resource Strain: Low Risk (07/20/2023)   Overall Financial Resource Strain (CARDIA)    Difficulty of Paying Living Expenses: Not hard at all  Food Insecurity: No Food Insecurity (07/20/2023)   Hunger Vital Sign    Worried About Running Out of Food in the Last Year: Never true    Ran Out of Food in the Last Year: Never true  Transportation Needs: No Transportation Needs (07/20/2023)   PRAPARE - Administrator, Civil Service (Medical): No    Lack of Transportation (Non-Medical): No  Physical Activity: Unknown (07/20/2023)   Exercise Vital Sign    Days of Exercise per Week: 0 days    Minutes of Exercise per Session: Not on file  Stress: No Stress Concern Present (07/20/2023)   [employer] of Occupational Health - Occupational Stress Questionnaire    Feeling of Stress : Only a little  Social Connections: Socially Isolated (07/20/2023)   Social Connection and Isolation Panel    Frequency of Communication with Friends and Family: Once a week    Frequency of Social Gatherings with Friends and Family: Once a week    Attends Religious Services: Never    Database Administrator or Organizations: No    Attends Engineer, Structural: Not on file    Marital Status: Living with partner  Depression (PHQ2-9): Low Risk (06/16/2024)   Depression (PHQ2-9)    PHQ-2 Score: 0  Alcohol Screen: Low Risk (07/20/2023)   Alcohol Screen    Last Alcohol Screening Score (AUDIT): 4  Housing: Low Risk (07/20/2023)   Housing Stability Vital Sign    Unable to Pay for Housing in the Last Year: No    Number of Times Moved in the Last Year: 0    Homeless in the Last Year: No  Utilities: Not on file  Health Literacy: Not on file        Objective:  Physical Exam: BP 102/80   Pulse 73   Temp 98.1 F (36.7 C) (Temporal)   Ht 6' 4 (1.93 m)   Wt 194 lb (88 kg)   SpO2 98%   BMI 23.61 kg/m   Body mass index is 23.61 kg/m. Wt  Readings from Last 3 Encounters:  06/16/24 194 lb (88 kg)  03/17/24 196 lb 3.2 oz (89 kg)  07/21/23 193 lb 12.8 oz (87.9 kg)   Gen: NAD, resting comfortably HEENT: TMs normal bilaterally. OP clear. No thyromegaly noted.  CV: RRR with no murmurs appreciated Pulm: NWOB, CTAB with no crackles, wheezes, or rhonchi GI: Normal bowel sounds present. Soft, Nontender, Nondistended. MSK: no edema, cyanosis, or clubbing noted Skin: warm, dry Neuro: CN2-12 grossly intact. Strength 5/5 in upper and lower extremities. Reflexes symmetric and intact bilaterally.  Psych: Normal affect and thought content     Silvanna Ohmer M. Kennyth, MD 06/16/2024 2:35 PM     [1] No Known Allergies

## 2024-06-16 NOTE — Assessment & Plan Note (Signed)
 Overall symptoms are stable on Maxalt  as needed.  Does not need refill today.

## 2024-06-16 NOTE — Assessment & Plan Note (Signed)
 His vertigo symptoms have improved significantly though still does occasionally get mild symptoms.  He has been trying to Epley maneuvers at home with some modest improvement though still getting occasional recurrences.  We did discuss referral to vestibular rehab specialist or ENT however he would like to hold off on this for now.  He will let us  know if he changes his mind or needs any further assistance.

## 2024-06-17 ENCOUNTER — Ambulatory Visit: Payer: Self-pay | Admitting: Family Medicine

## 2024-06-17 NOTE — Progress Notes (Signed)
 Cholesterol is mildly elevated but better than last time we checked.  Do not need to start meds for this but he should continue to work on diet and exercise.  All of his other labs are stable.  We can recheck everything in a year or so.  It is okay for us  to complete his form for work.
